# Patient Record
Sex: Female | Born: 1935 | Race: White | Hispanic: No | Smoking: Never smoker
Health system: Southern US, Community
[De-identification: ages and names within clinical notes are randomized; demographics above are authoritative.]

## PROBLEM LIST (undated history)

## (undated) DIAGNOSIS — R519 Headache, unspecified: Secondary | ICD-10-CM

## (undated) DIAGNOSIS — M542 Cervicalgia: Secondary | ICD-10-CM

## (undated) DIAGNOSIS — R51 Headache: Secondary | ICD-10-CM

## (undated) DIAGNOSIS — N390 Urinary tract infection, site not specified: Secondary | ICD-10-CM

## (undated) DIAGNOSIS — I10 Essential (primary) hypertension: Secondary | ICD-10-CM

## (undated) HISTORY — PX: APPENDECTOMY: SHX54

---

## 2005-03-29 ENCOUNTER — Ambulatory Visit: Payer: Self-pay | Admitting: Cardiology

## 2005-03-29 ENCOUNTER — Ambulatory Visit: Payer: Self-pay | Admitting: Internal Medicine

## 2005-03-29 ENCOUNTER — Inpatient Hospital Stay (HOSPITAL_COMMUNITY): Admission: EM | Admit: 2005-03-29 | Discharge: 2005-03-31 | Payer: Self-pay | Admitting: Emergency Medicine

## 2005-05-13 ENCOUNTER — Ambulatory Visit: Payer: Self-pay | Admitting: Internal Medicine

## 2006-08-09 ENCOUNTER — Encounter (HOSPITAL_COMMUNITY): Admission: RE | Admit: 2006-08-09 | Discharge: 2006-09-08 | Payer: Self-pay | Admitting: Internal Medicine

## 2007-03-01 ENCOUNTER — Ambulatory Visit (HOSPITAL_COMMUNITY): Payer: Self-pay | Admitting: Internal Medicine

## 2007-03-01 ENCOUNTER — Encounter (HOSPITAL_COMMUNITY): Admission: RE | Admit: 2007-03-01 | Discharge: 2007-03-31 | Payer: Self-pay | Admitting: Oncology

## 2008-02-20 ENCOUNTER — Encounter (HOSPITAL_COMMUNITY): Admission: RE | Admit: 2008-02-20 | Discharge: 2008-03-21 | Payer: Self-pay | Admitting: Internal Medicine

## 2008-02-20 ENCOUNTER — Ambulatory Visit (HOSPITAL_COMMUNITY): Payer: Self-pay | Admitting: Internal Medicine

## 2008-08-01 HISTORY — PX: CATARACT EXTRACTION: SUR2

## 2008-11-08 ENCOUNTER — Emergency Department (HOSPITAL_COMMUNITY): Admission: EM | Admit: 2008-11-08 | Discharge: 2008-11-08 | Payer: Self-pay | Admitting: Emergency Medicine

## 2010-05-12 ENCOUNTER — Emergency Department (HOSPITAL_COMMUNITY): Admission: EM | Admit: 2010-05-12 | Discharge: 2010-05-13 | Payer: Self-pay | Admitting: Emergency Medicine

## 2010-10-13 LAB — URINE CULTURE
Colony Count: >=100000
Culture  Setup Time: 201110140015

## 2010-10-13 LAB — DIFFERENTIAL
Basophils Absolute: 0 10*3/uL (ref 0.0–0.1)
Basophils Relative: 0 % (ref 0–1)
Eosinophils Absolute: 0.1 10*3/uL (ref 0.0–0.7)
Lymphocytes Relative: 10 % — ABNORMAL LOW (ref 12–46)
Lymphs Abs: 1.1 10*3/uL (ref 0.7–4.0)
Monocytes Absolute: 0.7 10*3/uL (ref 0.1–1.0)
Monocytes Relative: 6 % (ref 3–12)
Neutro Abs: 9.2 10*3/uL — ABNORMAL HIGH (ref 1.7–7.7)

## 2010-10-13 LAB — URINALYSIS, ROUTINE W REFLEX MICROSCOPIC
Glucose, UA: NEGATIVE mg/dL
Urobilinogen, UA: 0.2 mg/dL (ref 0.0–1.0)
pH: 5.5 (ref 5.0–8.0)

## 2010-10-13 LAB — BASIC METABOLIC PANEL
BUN: 13 mg/dL (ref 6–23)
CO2: 25 mEq/L (ref 19–32)
Calcium: 9.7 mg/dL (ref 8.4–10.5)
GFR calc Af Amer: >60 mL/min (ref >60)
Glucose, Bld: 137 mg/dL — ABNORMAL HIGH (ref 70–99)
Sodium: 135 mEq/L (ref 135–145)

## 2010-10-13 LAB — POCT CARDIAC MARKERS
Myoglobin, poc: 47 ng/mL (ref 12–200)
Troponin i, poc: <0.05 ng/mL (ref 0.00–0.09)

## 2010-10-13 LAB — CBC
HCT: 38.3 % (ref 36.0–46.0)
MCH: 26.3 pg (ref 26.0–34.0)

## 2010-10-13 LAB — LACTIC ACID, PLASMA: Lactic Acid, Venous: 2 mmol/L (ref 0.5–2.2)

## 2010-10-13 LAB — URINE MICROSCOPIC-ADD ON

## 2010-11-10 LAB — COMPREHENSIVE METABOLIC PANEL
AST: 20 U/L (ref 0–37)
Albumin: 3.9 g/dL (ref 3.5–5.2)
Calcium: 10 mg/dL (ref 8.4–10.5)
GFR calc Af Amer: >60 mL/min (ref >60)
GFR calc non Af Amer: >60 mL/min (ref >60)
Glucose, Bld: 131 mg/dL — ABNORMAL HIGH (ref 70–99)
Sodium: 135 mEq/L (ref 135–145)
Total Bilirubin: 0.8 mg/dL (ref 0.3–1.2)
Total Protein: 7.2 g/dL (ref 6.0–8.3)

## 2010-11-10 LAB — CBC
HCT: 44.3 % (ref 36.0–46.0)
MCHC: 33.9 g/dL (ref 30.0–36.0)
RDW: 14.4 % (ref 11.5–15.5)

## 2010-11-10 LAB — POCT CARDIAC MARKERS: Troponin i, poc: <0.05 ng/mL (ref 0.00–0.09)

## 2010-11-10 LAB — DIFFERENTIAL
Lymphocytes Relative: 16 % (ref 12–46)
Lymphs Abs: 1.8 10*3/uL (ref 0.7–4.0)
Monocytes Absolute: 0.9 10*3/uL (ref 0.1–1.0)
Monocytes Relative: 8 % (ref 3–12)
Neutro Abs: 8.9 10*3/uL — ABNORMAL HIGH (ref 1.7–7.7)

## 2010-11-10 LAB — URINE MICROSCOPIC-ADD ON

## 2010-11-10 LAB — URINALYSIS, ROUTINE W REFLEX MICROSCOPIC
Bilirubin Urine: NEGATIVE
Leukocytes, UA: NEGATIVE
Nitrite: NEGATIVE
Protein, ur: NEGATIVE mg/dL
Specific Gravity, Urine: 1.015 (ref 1.005–1.030)

## 2010-12-17 NOTE — Op Note (Signed)
NAME:  JHANIA, ETHERINGTON              ACCOUNT NO.:  [id number]   MEDICAL RECORD NO.:  [id number]          PATIENT TYPE:  INP   LOCATION:  A206                          FACILITY:  [facility]   PHYSICIAN:  Lionel December, M.D.    DATE OF BIRTH:  Dec 27, 1925   DATE OF PROCEDURE:  03/30/2005  DATE OF DISCHARGE:                                 OPERATIVE REPORT   PROCEDURE:  Esophagogastroduodenoscopy followed by colonoscopy.   ENDOSCOPIST:  Lionel December, M.D.   INDICATIONS:  Priscilla Wu is a 77 year old Caucasian female who is admitted  with weakness and presyncope.  She was found to have profound anemia  secondary to iron deficiency.  Her serum iron saturation and serum ferritin  were very low.  She is undergoing diagnostic evaluation.  Her stool is  guaiac negative.   Procedure risks were reviewed the patient, and informed consent was  obtained.   PREOPERATIVE MEDICATIONS:  Cetacaine spray pharyngeal topical anesthesia,  Demerol 25 mg IV, Versed 4 mg IV in divided dose.   DESCRIPTION OF PROCEDURE:  Procedure performed in endoscopy suite.  The  patient's vital signs and O2 sat were monitored during procedure and  remained stable.     1.  Esophagogastroduodenoscopy.  The patient was placed in the left lateral      position and Olympus videoscope was passed oropharynx without any      difficulty into esophagus.   Esophagus:  The mucosa of the esophagus normal.  GE junction was at 30 cm  from the incisors and was unremarkable. There was a large hernia with  dependent segment on the left side, but there was no food debris.  The  hiatus was at 38 cm.  There were three long linear ulcers involving the  herniated part of the stomach, all of which were on the lesser curvature  side.  No stigmata of active or recent bleeding noted.   Stomach:  It was empty and distended very well with insufflation.  Folds of  the proximal stomach were normal.  Examination of mucosa at body, antrum,  pyloric  channel as well as angularis, fundus, cardia was normal.  Hernia was  easily seen on this view, and no ulcers was noted at the level of hiatus or  just below that.   Duodenum:  Bulbar mucosa was normal.  The scope was passed to the second  part of duodenum.  The mucosa and folds were normal.  Endoscope was  withdrawn. The patient prepared for procedure #2.   1.  Colonoscopy:  Rectal examination performed.  No abnormality noted      external or digital exam.  Olympus videoscope was placed in the rectum      and advanced under vision ation into the sigmoid colon and beyond.      Preparation was excellent. She had some liquid stool which was easily      suctioned out.  Scope was passed into cecum.  It was identified by      appendiceal orifice and ileocecal valve.  Pictures were taken for the      record.  As the  scope was withdrawn, the colonic mucosa was carefully      examined.  Scattered diverticula noted throughout the colon and few more      in number at sigmoid colon, but there no polyps, tumor, masses or      angiodysplasia.  Rectal mucosa was normal.  The scope was retroflexed to      examine anorectal junction and small hemorrhoids were noted below the      dentate line. Endoscope was straightened and withdrawn.  The patient      tolerated the procedure well.   FINAL DIAGNOSES:  1.  Large sliding hiatal hernia with linear ulcer involving herniated part      of the stomach.  2.  Normal examination of the rest of the stomach, first and second part of      the duodenum.  3.  Pan colonic diverticulosis.  4.  Small external hemorrhoids.  5.  I feel findings on EGD would explain the patient's iron-deficiency      anemia secondary to chronic GI blood loss.   RECOMMENDATIONS:  1.  Anti-reflux measures.  I would like her to stay on PPI double dose for      now.  2.  Ferrous sulfate 325 mg p.o. bid.  3.  High-fiber diet.  4.  We would plan to see her in the office in two months.  If  response to      medical therapy is unsatisfactory would recommend surgery.      Lionel December, M.D.  Electronically Signed     NR/MEDQ  D:  03/30/2005  T:  03/31/2005  Job:  [id number]   cc:   Osvaldo Shipper, MD

## 2010-12-17 NOTE — H&P (Signed)
Priscilla Wu, Priscilla Wu              ACCOUNT NO.:  [id number]   MEDICAL RECORD NO.:  [id number]          PATIENT TYPE:  INP   LOCATION:  A206                          FACILITY:  [facility]   PHYSICIAN:  Osvaldo Shipper, MD     DATE OF BIRTH:  [birth year]   DATE OF ADMISSION:  03/29/2005  DATE OF DISCHARGE:  LH                                HISTORY & PHYSICAL   The patient does not have a medical doctor.   ADMISSION DIAGNOSES:  1.  Severe anemia.  2.  Near syncope, likely related to #1.  3.  Chronic back pain.   CHIEF COMPLAINT:  Almost passed out.   HISTORY OF PRESENT ILLNESS:  The patient is a 77 year old Caucasian female  whose medical problems just include chronic back pain which started about  three years ago secondary to vertebral fractures secondary to trauma.  The  patient just takes Tylenol for pain and denies taking any NSAIDs.  The  patient mentioned that she is an avid walker.  However, for the past six  months she has not been walking because of back problems.  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  The patient went back to walking today  with her friend.  About a half a mile into her walk, the patient felt  palpitations.  Immediately following that, she felt that she was going to  pass out, and she sat down on the floor, however, did not actually have a  syncopal episode.  The patient also gives history that for the past two  months she has been feeling more tired and weak.  She has been feeling more  short of breath with activities such as vacuuming etc.  She also gives a  history of palpitations that she has experienced in the past two months as  well.  She has never had such an episode of near syncope or episodes of  syncope in the past.   The patient denied any chest pain or shortness of breath with the current  episode.  She denies any seizure activity.  She denied any focal weakness.  The patient also does not give a history of suggestive of melena or  any  hematochezia.  She has not noticed any change in her bowel habits.  She does  give a history of occasional heartburn for which she takes Pepcid-AC  occasionally.  This is not a persistent problem.  She once again denied  excessive use of any kind of pain medication.  She only takes Tylenol very  occasionally.   MEDICATIONS:  Tylenol for pain.  Pepcid-AC, multivitamins, calcium, and  vitamin D on a p.r.n. basis.   ALLERGIES:  PENICILLIN which gives her a rash.   PAST MEDICAL HISTORY:  Very unremarkable except for lumbar vertebral  fractures resulting from a trauma about three years ago.  She does not have  any heart disease, lung disease, diabetes, strokes in the past.  She gives a  history of appendectomy and cataract surgery in the past.  She also has a  history of Bell's palsy in the  past.   SOCIAL HISTORY:  The patient  lives in [place], lives alone, denies cigarette  use, alcohol use, or any illicit drug use.  Six months prior, the patient  was an avid walker, however, has not been doing so in the past six months.  The patient has never had a colonoscopy or an EGD in the past.   FAMILY HISTORY:  Father died at the age of 33 of a stroke and pneumonia.  Mother died at the age of 16 from complications of flu.  She has one brother  and four sisters who have hypertension, cholesterol, arthritis, and her  brother has lung cancer (He is a smoker). No history of colon cancer or  other cancers in the family.   REVIEW OF SYSTEMS:  A 10-point review of systems was done which was  unremarkable.  Denied any vaginal bleeding or spotting.   PHYSICAL EXAMINATION:  Vital signs:  Temperature is 97.4, blood pressure  139/76, heart rate is 124 and regular, oxygenating 100% on 2 L, respiratory  rate is about 16.  Orthostatics were not done because the patient was  symptomatic at presentation.  General exam:  This is a moderately built elderly woman, very pleasant to  talk to, in no apparent  distress.   HEENT:  There is marked pallor.  No icterus.  Oral mucosa slightly dry.  No  oral lesions are seen.  Neck is soft and supple.   Lungs reveal a few crackles at the bases which are somewhat clear with  coughing.  No wheezing or rhonchi present.   Cardiovascular:  S1, S2 tachycardic.  Regular with no murmurs appreciated.  No bruits are heard.   Abdomen is soft, obese, nontender, and nondistended.  Bowel sounds are  present.  No organomegaly or masses are appreciated.  Rectal exam was  deferred as was done by the ER physician which was apparently heme-negative.   Extremities reveal no edema.  Peripheral pulses are palpable.   Neurologic:  The patient is alert and oriented x3.  No focal deficits are  noted.   LABORATORY DATA:  White count is 6.4, hemoglobin 7.6, hematocrit 25, MCV 61,  platelet count 309.  RDW is 18.  Neutrophil count is 80%.   PT/INR is normal.  D-dimer is normal.  No PTT is available at this time.   Sodium is 133, potassium 3.8, chloride 104, bicarbonate 22, glucose 143, BUN  11, creatinine 1.0.  LFTs within normal limits.  Albumin mildly low at 3.4.   Initial set of cardiac enzymes are negative.  BN peptide less than 30.  UA  shows mild protein, negative for infection or bilirubin.   ABG shows pH of 7.422, pCO2 33, PO2 121, bicarbonate 21.  Oxygen saturation  99%.  The patient had a chest x-ray which did not show any acute  abnormality.   EKG shows sinus at the rate of 125.  There seems to be a mild left axis  deviation.  The PR/QT/QRS are all within normal limits.  No concerning ST  changes are seen in II, III, and aVF, V1, V2, V3.  There is mild depression  of ST in V4 and V5.  No Q waves are appreciated at this time.  The ST  changes could be related to rate at this time.   IMPRESSION:  This is a 77 year old Caucasian female with no real past medical history except for chronic back pain from vertebral fractures in the  past who presents with an  episode suggestive of near syncope.  The patient  is found to have severe anemia.  She does not have a history of anemia in  the past as far as she knows.  She mentioned she had blood work done about  five years ago and everything was essentially normal at that time.  The  patient has a history of some symptoms over the past two months of feeling  more tired and weak and short of breath which could all be related to  anemia.  In an elderly woman with no obvious cause, the most concerning  reason for the anemia is malignancy which could be in the GI tract  considering microcytic nature of the anemia.  It is most likely that this  anemia is the reason for her near syncope at this time.  The patient does  not have any cardiac history.   PLAN:  1.  Near syncope, likely related to severe anemia.  We will admit the      patient, however, to telemetry and rule her out for acute coronary      syndrome.  We will check a 2D echocardiogram on this patient.   1.  Anemia.  The patient seems to have microcytic anemia.  We will check      iron profile on this patient.  I will have gastroenterology consult on      this patient to consider endoscopies.  Since the patient is mildly      symptomatic, we will transfuse her with one unit of blood at this time.      We will obtain blood work prior to the transfusion.   Further management decisions will be based on results of initial testing and  how patient responds to treatment.      Osvaldo Shipper, MD  Electronically Signed     GK/MEDQ  D:  03/29/2005  T:  03/29/2005  Job:  [id number]   cc:   R. Roetta Sessions, M.D.  [address]  [place]  [place] [address]   Dorthula Rue Early Chars, MD  Fax: [phone]

## 2010-12-17 NOTE — Procedures (Signed)
NAMESHAQUOIA, MIERS              ACCOUNT NO.:  [id number]   MEDICAL RECORD NO.:  [id number]          PATIENT TYPE:  INP   LOCATION:  A206                          FACILITY:  [facility]   PHYSICIAN:  [place] Bing, M.D. [facility] OF BIRTH:  07-Apr-1926   DATE OF PROCEDURE:  03/29/2005  DATE OF DISCHARGE:                                  ECHOCARDIOGRAM   REFERRING:  Dr. Rito Ehrlich   CLINICAL DATA:  [age]-year-old woman with syncope.   M-MODE:  Aorta 3.0, left atrium 2.9, septum 1.1, posterior wall 0.9, LV  diastole 4.2, LV systole 3.1.   1.  Technically adequate except for unacceptable quality of the apical      images.  2.  Normal left atrium, right atrium and right ventricle.  3.  Normal aortic root and aortic arch.  4.  Normal aortic, mitral, tricuspid and pulmonic valves; mild mitral      annular calcification; normal proximal pulmonary artery.  5.  Normal internal dimension, wall thickness, regional and global function      of the left ventricle.       Bing, M.D. [facility]  Electronically Signed     RR/MEDQ  D:  03/29/2005  T:  03/29/2005  Job:  [id number]

## 2010-12-17 NOTE — Consult Note (Signed)
Priscilla Wu              ACCOUNT NO.:  [id number]   MEDICAL RECORD NO.:  [id number]          PATIENT TYPE:  INP   LOCATION:  A206                          FACILITY:  [facility]   PHYSICIAN:  Lionel December, M.D.    DATE OF BIRTH:  Oct 18, 1925   DATE OF CONSULTATION:  03/29/2005  DATE OF DISCHARGE:                                   CONSULTATION   CONSULTING PHYSICIAN:  Dr. Haskell Riling.   REASON FOR CONSULTATION:  Microcytic anemia suspected to be iron-deficiency  anemia.   HISTORY OF PRESENT ILLNESS:  Priscilla Wu is a 77 year old Caucasian female who  was admitted to Dr. Chancy Milroy service earlier today following a syncopal  episode.  On initial presentation she was noted to have profound anemia.  While in the emergency room, she also had normal troponin levels.  The  patient is getting ready to receive one unit of PRBCs.  The patient states  she was find this morning.  She walked to her neighbor's so that they could  walk together.  While they were walking on a flat surface, her friend noted  her to be dyspneic.  She really did not feel anything unusual.  She stopped  walking.  She felt lightheaded and dizzy, and she thought she was going to  pass out.  She, therefore, gradually eased herself into a sitting position.  She states that she did not actually pass out.  She did not have chest pain  or diaphoresis.  Her friend called 911 and she was brought to the ER.  The  patient has a history of intermittent heartburn.  She is taking Pepcid.  She  states occasionally it is pretty bad at night, particularly if she consumes  fried food.  However, she denies dysphagia, odynophagia, abdominal pain,  melena, or rectal bleeding.  She does not take any OTC NSAIDs.  She states  her appetite is normal.  She has lost 10 pounds in the last couple of months  because she has had problems with her dentures.  She still is unable to wear  her lower dentures.   REVIEW OF SYSTEMS:  Negative for  fever, chills, or night sweats.  It is also  negative for nausea or vomiting.  She denies dysuria, hematuria, or vaginal  bleeding.  She, however, has not had any pelvic exam in at least five years.  She complains of chronic back pain.  She is generally able to control the  symptoms by not standing for extended time periods.  This started about 3-4  years ago, after she fell and broke four of her vertebrae.  She was  diagnosed with osteoporosis.  She was placed on Actonel; however, she could  not afford the medicine and presently is taking vitamin D and calcium only.  She has not undergone a colonoscopy in the past.   She is on calcium with vitamin D and multivitamin daily and Pepcid p.r.n.   PAST MEDICAL HISTORY:  1.  History of osteoporosis and multiple fractures of vertebrae, presently      on calcium with vitamin D only.  2.  She is status post appendectomy.  3.  Bilateral cataract surgery.  4.  Dr. Rito Ehrlich indicates she also had Bell palsy, although she forgot to      mention that to me.   ALLERGIES:  PENICILLIN which caused a rash.   FAMILY HISTORY:  Noncontributory.  She lost one brother of lung carcinoma at  age 73.  He was a heavy smoker and died within four months of diagnosis.  She has four sisters and a brother living.   SOCIAL HISTORY:  She is a widow.  Her husband died 12 years ago.  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  She is  now retired.  She does not smoke  cigarettes or drink alcohol.  She has five children in good health.  One of  her sons lives right behind her.  Son lives next door and checks on her  every day.   PHYSICAL EXAMINATION:  GENERAL:  A pleasant, well-developed, well-nourished,  Caucasian female who is in no acute distress.  VITAL SIGNS:  Admission weight 161.2 pounds.  She is 67 inches tall.  Pulse  129 per minute, blood pressure 139/70, respiratory rate is 18 and temp is  97.  HEENT:  Conjunctivae is pale.  Sclerae is nonicteric.   Oropharyngeal mucosa  is normal.  She has upper dentures in place.  NECK:  No neck masses or thyromegaly noted.  CARDIAC:  Regular rhythm.  Normal S1, S2.  No murmur or gallop noted.  LUNGS:  Clear to auscultation.  ABDOMEN:  Full.  Bowel sounds are normal.  On palpation it is soft and  nontender without organomegaly or masses.  RECTAL:  Reveals formed stool in the vault which is guaiac negative.  EXTREMITIES:  No peripheral edema, clubbing, or __________  noted.   LABS:  On admission, WBC 6.4, H&H is 7.6 and 25, platelet count is 309K, MCV  is 61.7.  Sodium 133, potassium 3.8, chloride 104, CO2 is 22, glucose is  143, BUN 11, creatinine 1.0.  Bilirubin is 0.3, alkaline phosphatase 58, AST  22, ALT 12, total protein 6.8 with albumin of 3.4, calcium is 9.0.  Troponin  is less than 0.05.  Her iron studies are pending.   ASSESSMENT:  [name] is a pleasant, 77 year old, Caucasian female who  presents with presyncope and found to be profoundly anemic with a hemoglobin  of 7.6 and a very low MCV.  She has intermittent heartburn but no history of  melena, rectal bleeding, hematuria, or vaginal bleed.  She also does not  have any symptoms to suggest malabsorption.  Lately she has not been eating  much in the way of meat because of problems with dentures but this would not  explain her iron deficiency.  With the presentation, we have to be concerned  about chronic blood loss from her upper gastrointestinal tract.  Given that  she has gastroesophageal reflux disease symptoms, she could be loosing blood  from her esophagus or we could be dealing with a colonic neoplasm or  angiodysplasia.  I agree with treating her with PPI and giving her one unit  of PRBCs.   RECOMMENDATIONS:  1.  Esophagogastroduodenoscopy, followed by colonoscopy to be performed in      the a.m.      1.  She will be prepped this afternoon.      2.  I have reviewed the procedures with the patient and her daughter and  she is agreeable.  2.  I also told them that if both of these studies are negative and iron      studies confirm iron-deficiency anemia, we will also need capsule study.   We would like to thank Dr. Rito Ehrlich for the opportunity to participate in  the care of this nice lady.      Lionel December, M.D.  Electronically Signed    NR/MEDQ  D:  03/29/2005  T:  03/29/2005  Job:  [id number]

## 2010-12-17 NOTE — Discharge Summary (Signed)
NAMEKEIRSTON, Priscilla Wu              ACCOUNT NO.:  [id number]   MEDICAL RECORD NO.:  [id number]          PATIENT TYPE:  INP   LOCATION:  A206                          FACILITY:  [facility]   PHYSICIAN:  Osvaldo Shipper, MD     DATE OF BIRTH:  01-27-1926   DATE OF ADMISSION:  03/29/2005  DATE OF DISCHARGE:  08/31/2006LH                                 DISCHARGE SUMMARY   DISCHARGE DIAGNOSES:  1.  Iron deficiency anemia.  2.  Large hiatal hernia with ulcerations, likely causing #1.   Please review H&P dictated at the time of admission for details regarding  the patient's presenting illness.   BRIEF HOSPITAL COURSE:  1.  Iron deficiency anemia.  Briefly, this is a 77 year old Caucasian female      with past medical history which is quite unremarkable except for a      history of osteoporosis, history of lumbar vertebral fracture secondary      to trauma, who presented to the ED after having an episode of near      syncope.  The patient did not complain of any chest pain.  Did have some      shortness of breath.  The patient was found to have severe anemia, with      a hemoglobin of 7.6.  No obvious source for blood loss was elicited      during her admission.  Since she was elderly and was complaining of some      heartburn previously, and she is profoundly anemic, the patient was      referred to gastroenterology for endoscopies.  The patient underwent EGD      and a total colonoscopy.  The EGD showed a large hiatal hernia with      three long linear ulcers involving the herniated part of the stomach.      All were found on the lesser curvature.  No active bleeding was seen      otherwise.  The rest of the stomach and the duodenum were found to be      normal.  The patient also underwent a total colonoscopy which showed      pancolonic diverticulosis which also did not suggest any active      bleeding.  The patient's iron profile studies showed iron to be 10,      ferritin was 3, saturation  percentage was 2%, TIBC was 410.  B-12 and      folate were within normal range.  The patient was severely iron      deficient.  She denied any vaginal bleeding at any time. She denied any      hematuria at any time.  The patient did undergo a UA in the hospital      which did not show any blood.  The patient was initially transfused 1      unit of blood at the time of admission.  The above blood work was      obtained prior to the transfusion.  The patient's symptoms improved.      She was able to ambulate  in the hospital within her room with no      difficulties.  She did not have any more dizziness.  She also ruled out      for acute coronary syndrome by the way of cardiac enzymes.  She also      underwent echocardiogram which essentially showed normal EF with normal      wall motion.  No other abnormalities were found.   On the day of discharge, the patient's hemoglobin is slightly low at 27.9.  Once again, the patient denies any blood loss in the form of melena,  hematochezia, hematuria, or vaginal spotting.  Since the patient is able to  ambulate with no difficulty, and she does not have any more symptoms, I will  be transfusing her 1 more unit of blood today, since the iron will take some  time to build up the stores, and I will discharge her later today.   The patient lives alone.  However, her son lives right next door to her, and  she says that she has help around the clock if need be.  Otherwise, the  patient is very active.  She goes for long walks, and she does not have any  other significant comorbidities.   DISCHARGE MEDICATIONS:  1.  Protonix 40 mg p.o. b.i.d.  2.  Ferrous sulfate 325 mg p.o. b.i.d.   FOLLOW-UP CARE:  1.  The patient is unassigned.  She would be willing to see a physician here      in [place].  Today's unassigned physician is Dr. Early Chars.  We will set      up an appointment to see him in one week.  2.  The patient will need to have a CBC checked one  day prior to her      appointment.  3.  Patient to see Dr. Karilyn Cota in his office in 8 weeks' time.   DIET:  Patient to avoid fatty food, oily food, and fried food to cause  exacerbation of her heartburn.  Otherwise, no other restrictions.   PHYSICAL ACTIVITY:  The patient may increase her activity slowly.   SPECIAL STUDIES DONE DURING THIS HOSPITAL STAY:  1.  EGD and total colonoscopy, as mentioned above.  2.  The patient underwent echocardiogram, as mentioned above.  3.  She had a portable chest x-ray which showed chronic lung changes and a      large hiatal hernia.  Otherwise, no acute process was seen.      Osvaldo Shipper, MD  Electronically Signed     GK/MEDQ  D:  03/31/2005  T:  03/31/2005  Job:  [id number]   cc:   Dorthula Rue. Early Chars, MD  Fax: [phone]   Lionel December, M.D.  [address]  [place]  [place] [address]

## 2011-08-09 ENCOUNTER — Ambulatory Visit (HOSPITAL_COMMUNITY)
Admission: RE | Admit: 2011-08-09 | Discharge: 2011-08-09 | Disposition: A | Discharge status: Home / Self Care | Payer: Medicare Other | Source: Ambulatory Visit | Attending: Family Medicine | Admitting: Family Medicine

## 2011-08-09 ENCOUNTER — Other Ambulatory Visit (HOSPITAL_COMMUNITY): Payer: Self-pay | Admitting: Family Medicine

## 2011-08-09 ENCOUNTER — Encounter (HOSPITAL_COMMUNITY): Payer: Self-pay

## 2011-08-09 DIAGNOSIS — I10 Essential (primary) hypertension: Secondary | ICD-10-CM | POA: Insufficient documentation

## 2011-08-09 DIAGNOSIS — R51 Headache: Secondary | ICD-10-CM | POA: Insufficient documentation

## 2011-08-09 HISTORY — DX: Essential (primary) hypertension: I10

## 2012-04-23 ENCOUNTER — Other Ambulatory Visit (HOSPITAL_COMMUNITY): Payer: Self-pay | Admitting: Family Medicine

## 2012-04-23 DIAGNOSIS — M810 Age-related osteoporosis without current pathological fracture: Secondary | ICD-10-CM

## 2012-04-25 ENCOUNTER — Ambulatory Visit (HOSPITAL_COMMUNITY)
Admission: RE | Admit: 2012-04-25 | Discharge: 2012-04-25 | Disposition: A | Discharge status: Home / Self Care | Payer: Medicare Other | Source: Ambulatory Visit | Attending: Family Medicine | Admitting: Family Medicine

## 2012-04-25 DIAGNOSIS — Z780 Asymptomatic menopausal state: Secondary | ICD-10-CM | POA: Insufficient documentation

## 2012-04-25 DIAGNOSIS — M810 Age-related osteoporosis without current pathological fracture: Secondary | ICD-10-CM

## 2012-04-25 DIAGNOSIS — M818 Other osteoporosis without current pathological fracture: Secondary | ICD-10-CM | POA: Insufficient documentation

## 2012-09-01 ENCOUNTER — Encounter (HOSPITAL_COMMUNITY): Payer: Self-pay | Admitting: *Deleted

## 2012-09-01 ENCOUNTER — Emergency Department (HOSPITAL_COMMUNITY)
Admission: EM | Admit: 2012-09-01 | Discharge: 2012-09-01 | Disposition: A | Discharge status: Home / Self Care | Payer: Medicare Other | Attending: Emergency Medicine | Admitting: Emergency Medicine

## 2012-09-01 ENCOUNTER — Emergency Department (HOSPITAL_COMMUNITY): Payer: Medicare Other

## 2012-09-01 DIAGNOSIS — I10 Essential (primary) hypertension: Secondary | ICD-10-CM | POA: Insufficient documentation

## 2012-09-01 DIAGNOSIS — Z79899 Other long term (current) drug therapy: Secondary | ICD-10-CM | POA: Insufficient documentation

## 2012-09-01 DIAGNOSIS — H7290 Unspecified perforation of tympanic membrane, unspecified ear: Secondary | ICD-10-CM

## 2012-09-01 DIAGNOSIS — H6500 Acute serous otitis media, unspecified ear: Secondary | ICD-10-CM | POA: Insufficient documentation

## 2012-09-01 DIAGNOSIS — Z87440 Personal history of urinary (tract) infections: Secondary | ICD-10-CM | POA: Insufficient documentation

## 2012-09-01 DIAGNOSIS — R42 Dizziness and giddiness: Secondary | ICD-10-CM | POA: Insufficient documentation

## 2012-09-01 DIAGNOSIS — H6590 Unspecified nonsuppurative otitis media, unspecified ear: Secondary | ICD-10-CM

## 2012-09-01 HISTORY — DX: Urinary tract infection, site not specified: N39.0

## 2012-09-01 LAB — COMPREHENSIVE METABOLIC PANEL
ALT: 12 U/L (ref 0–35)
AST: 15 U/L (ref 0–37)
Alkaline Phosphatase: 55 U/L (ref 39–117)
BUN: 16 mg/dL (ref 6–23)
Chloride: 95 mEq/L — ABNORMAL LOW (ref 96–112)
Creatinine, Ser: 0.91 mg/dL (ref 0.50–1.10)
GFR calc Af Amer: 65 mL/min — ABNORMAL LOW (ref >90)
Glucose, Bld: 103 mg/dL — ABNORMAL HIGH (ref 70–99)

## 2012-09-01 LAB — URINALYSIS, ROUTINE W REFLEX MICROSCOPIC
Bilirubin Urine: NEGATIVE
Glucose, UA: NEGATIVE mg/dL
Nitrite: NEGATIVE
Protein, ur: NEGATIVE mg/dL
Urobilinogen, UA: 0.2 mg/dL (ref 0.0–1.0)
pH: 7 (ref 5.0–8.0)

## 2012-09-01 LAB — CBC WITH DIFFERENTIAL/PLATELET
Basophils Absolute: 0.1 10*3/uL (ref 0.0–0.1)
Basophils Relative: 1 % (ref 0–1)
Lymphs Abs: 2 10*3/uL (ref 0.7–4.0)
MCHC: 32.3 g/dL (ref 30.0–36.0)
MCV: 84.3 fL (ref 78.0–100.0)
Monocytes Absolute: 0.6 10*3/uL (ref 0.1–1.0)
Neutro Abs: 4.7 10*3/uL (ref 1.7–7.7)
Neutrophils Relative %: 63 % (ref 43–77)
RBC: 5.03 MIL/uL (ref 3.87–5.11)
RDW: 14 % (ref 11.5–15.5)

## 2012-09-01 MED ORDER — SODIUM CHLORIDE 0.9 % IV SOLN
INTRAVENOUS | Status: DC
  Administered 2012-09-01: 16:00:00 via INTRAVENOUS

## 2012-09-01 MED ORDER — SODIUM CHLORIDE 0.9 % IV BOLUS (SEPSIS)
250.0000 mL | Freq: Once | INTRAVENOUS | Status: AC
  Administered 2012-09-01: 250 mL via INTRAVENOUS

## 2012-09-01 NOTE — ED Notes (Signed)
Pt states pain to left ear x 1 week. Dizziness when she woke up and fell this morning. Family states she hasn't felt well all week.

## 2012-09-01 NOTE — ED Provider Notes (Signed)
History  Scribed for Priscilla Jakes, MD, the patient was seen in room APA04/APA04. This chart was scribed by Candelaria Stagers. The patient's care started at 4:25 PM    CSN: [id number]  Arrival date & time 09/01/12  1600   First MD Initiated Contact with Patient 09/01/12 1613      Chief Complaint  Patient presents with  . Otalgia  . Fall     The history is provided by the patient and a relative. No language interpreter was used.   Priscilla Wu is a 79 y.o. female who presents to the Emergency Department complaining of left ear pain and dizziness that started about one week ago and became worse today.  She reports she has had one episode of dizziness this week and an episode today followed by losing her balance and falling.  She has no injuries from the fall.  She denies vertigo and states the dizziness feels like her balance is off.  Her family expresses concern over weakness that started over this last week.  She denies visual changes, SOB, abdominal pain, nausea, vomiting, or diarrhea.  She has been experiencing cold sx over the last few weeks.  Pt also reports drainage from the left ear.        Past Medical History  Diagnosis Date  . Hypertension   . UTI (lower urinary tract infection)     Past Surgical History  Procedure Date  . Appendectomy     No family history on file.  History  Substance Use Topics  . Smoking status: Never Smoker   . Smokeless tobacco: Not on file  . Alcohol Use: No    OB History    Grav Para Term Preterm Abortions TAB SAB Ect Mult Living                  Review of Systems  Constitutional: Negative for fever and chills.  HENT: Positive for ear pain (left ear pain), congestion and ear discharge (left ear discharge).   Eyes: Negative for visual disturbance.  Respiratory: Negative for shortness of breath.   Cardiovascular: Negative for chest pain.  Gastrointestinal: Negative for nausea, vomiting, abdominal pain and diarrhea.   Genitourinary: Negative for dysuria.  Musculoskeletal: Negative for back pain and arthralgias.  Skin: Negative for rash.  Neurological: Positive for dizziness (no vertigo) and weakness.  All other systems reviewed and are negative.    Allergies  Penicillins  Home Medications   Current Outpatient Rx  Name  Route  Sig  Dispense  Refill  . LISINOPRIL-HYDROCHLOROTHIAZIDE 20-12.5 MG PO TABS   Oral   Take 1 tablet by mouth daily.         Marland Kitchen ONE-DAILY MULTI VITAMINS PO TABS   Oral   Take 1 tablet by mouth daily.         Marland Kitchen OMEPRAZOLE MAGNESIUM 20 MG PO TBEC   Oral   Take 20 mg by mouth daily.           BP 134/78  Pulse 116  Temp 97.6 F (36.4 C) (Oral)  Resp 18  Ht 5\' 5"  (1.651 m)  Wt 145 lb (65.772 kg)  BMI 24.13 kg/m2  SpO2 97%  Physical Exam  Nursing note and vitals reviewed. Constitutional: She is oriented to person, place, and time. She appears well-developed and well-nourished. No distress.  HENT:  Head: Normocephalic and atraumatic.  Right Ear: Tympanic membrane normal.  Mouth/Throat: Oropharynx is clear and moist.       Possible  hole in left eardrum  Eyes: EOM are normal.  Neck: Neck supple. No tracheal deviation present.  Cardiovascular: Regular rhythm.   No murmur heard.      Tachycardic   Pulmonary/Chest: Effort normal and breath sounds normal. No respiratory distress. She has no wheezes. She has no rales.  Abdominal: Bowel sounds are normal. There is no tenderness.  Musculoskeletal: Normal range of motion. She exhibits no edema and no tenderness.  Neurological: She is alert and oriented to person, place, and time. No cranial nerve deficit.       Normal strength, equal and bilateral.  No pronator drift.  Skin: Skin is warm and dry.  Psychiatric: She has a normal mood and affect. Her behavior is normal.    ED Course  Procedures  DIAGNOSTIC STUDIES: Oxygen Saturation is 97% on room air, normal by my interpretation.    COORDINATION OF  CARE: 4:15 PM Ordered: CBC with Differential; Comprehensive metabolic panel; Urinalysis, Routine w reflex microscopic; ED EKG 4:32 PM Will order screenings and CT Head.  Advised pt to follow up with HENT for left ear pain.  Pt and family understand and agree.  4:37PM Ordered: CT Head Wo Contrast   Labs Reviewed  COMPREHENSIVE METABOLIC PANEL - Abnormal; Notable for the following:    Sodium 133 (*)     Chloride 95 (*)     Glucose, Bld 103 (*)     Calcium 10.6 (*)     GFR calc non Af Amer 56 (*)     GFR calc Af Amer 65 (*)     All other components within normal limits  CBC WITH DIFFERENTIAL  URINALYSIS, ROUTINE W REFLEX MICROSCOPIC   Ct Head Wo Contrast  09/01/2012  *RADIOLOGY REPORT*  Clinical Data: Dizzy and weak.  Otalgia.  CT HEAD WITHOUT CONTRAST  Technique:  Contiguous axial images were obtained from the base of the skull through the vertex without contrast.  Comparison: None.  Findings: Mild generalized atrophy white matter disease is similar to the prior study.  No acute cortical infarct, hemorrhage, or mass lesion is present.  The ventricles are proportionate to the degree of atrophy.  No significant extra-axial fluid collection is present.  Wall thickening is present in the maxillary sinuses bilaterally. The paranasal sinuses and mastoid air cells are clear.  IMPRESSION:  1.  No acute intracranial abnormality or significant interval change. 2.  Stable atrophy white matter disease. 3.  Thickening of the walls of the maxillary sinus suggests a history chronic or recurrent sinusitis.  There is no active disease.   Original Report Authenticated By: Marin Roberts, M.D.     Date: 09/01/2012  Rate: 102  Rhythm: sinus tachycardia  QRS Axis: normal  Intervals: normal  ST/T Wave abnormalities: normal  Conduction Disutrbances:first-degree A-V block   Narrative Interpretation:   Old EKG Reviewed: unchanged From 05/12/10   Results for orders placed during the hospital encounter of  09/01/12  CBC WITH DIFFERENTIAL      Component Value Range   WBC 7.5  4.0 - 10.5 K/uL   RBC 5.03  3.87 - 5.11 MIL/uL   Hemoglobin 13.7  12.0 - 15.0 g/dL   HCT 47.8  29.5 - 62.1 %   MCV 84.3  78.0 - 100.0 fL   MCH 27.2  26.0 - 34.0 pg   MCHC 32.3  30.0 - 36.0 g/dL   RDW 30.8  65.7 - 84.6 %   Platelets 173  150 - 400 K/uL   Neutrophils Relative 63  43 - 77 %   Neutro Abs 4.7  1.7 - 7.7 K/uL   Lymphocytes Relative 27  12 - 46 %   Lymphs Abs 2.0  0.7 - 4.0 K/uL   Monocytes Relative 8  3 - 12 %   Monocytes Absolute 0.6  0.1 - 1.0 K/uL   Eosinophils Relative 2  0 - 5 %   Eosinophils Absolute 0.2  0.0 - 0.7 K/uL   Basophils Relative 1  0 - 1 %   Basophils Absolute 0.1  0.0 - 0.1 K/uL  COMPREHENSIVE METABOLIC PANEL      Component Value Range   Sodium 133 (*) 135 - 145 mEq/L   Potassium 3.9  3.5 - 5.1 mEq/L   Chloride 95 (*) 96 - 112 mEq/L   CO2 28  19 - 32 mEq/L   Glucose, Bld 103 (*) 70 - 99 mg/dL   BUN 16  6 - 23 mg/dL   Creatinine, Ser 1.19  0.50 - 1.10 mg/dL   Calcium 14.7 (*) 8.4 - 10.5 mg/dL   Total Protein 7.7  6.0 - 8.3 g/dL   Albumin 3.9  3.5 - 5.2 g/dL   AST 15  0 - 37 U/L   ALT 12  0 - 35 U/L   Alkaline Phosphatase 55  39 - 117 U/L   Total Bilirubin 0.3  0.3 - 1.2 mg/dL   GFR calc non Af Amer 56 (*) >90 mL/min   GFR calc Af Amer 65 (*) >90 mL/min  URINALYSIS, ROUTINE W REFLEX MICROSCOPIC      Component Value Range   Color, Urine YELLOW  YELLOW   APPearance CLEAR  CLEAR   Specific Gravity, Urine 1.010  1.005 - 1.030   pH 7.0  5.0 - 8.0   Glucose, UA NEGATIVE  NEGATIVE mg/dL   Hgb urine dipstick NEGATIVE  NEGATIVE   Bilirubin Urine NEGATIVE  NEGATIVE   Ketones, ur NEGATIVE  NEGATIVE mg/dL   Protein, ur NEGATIVE  NEGATIVE mg/dL   Urobilinogen, UA 0.2  0.0 - 1.0 mg/dL   Nitrite NEGATIVE  NEGATIVE   Leukocytes, UA NEGATIVE  NEGATIVE     1. Dizziness   2. Tympanic membrane perforation   3. Serous otitis media       MDM  Patient with balance disturbance  not true vertigo not true dizziness. Workup for this was negative except for the finding of a left TM perforation with serous discharge no purulent discharge. This may be responsible for her balance being off. The rest of her neuro exam was normal head CT did not show any evidence of a stroke. However MRI would be more specific. Recommend followup with her primary care doctor on Monday for recheck of the left ear and also for consideration for ordering an MRI. MRI not available at this time in the ED. Patient also has an ear nose and throat Dr. Nicholes Rough recommended to make an appointment for followup with them as well.  Patient has a walker that can be used at home she knows to be very careful so that she doesn't fall again.     I personally performed the services described in this documentation, which was scribed in my presence. The recorded information has been reviewed and is accurate.          Priscilla Jakes, MD 09/01/12 [phone]

## 2012-09-10 ENCOUNTER — Ambulatory Visit (HOSPITAL_COMMUNITY)
Admission: RE | Admit: 2012-09-10 | Discharge: 2012-09-10 | Disposition: A | Discharge status: Home / Self Care | Payer: Medicare Other | Source: Ambulatory Visit | Attending: Family Medicine | Admitting: Family Medicine

## 2012-09-10 DIAGNOSIS — IMO0001 Reserved for inherently not codable concepts without codable children: Secondary | ICD-10-CM | POA: Insufficient documentation

## 2012-09-10 DIAGNOSIS — Z9181 History of falling: Secondary | ICD-10-CM | POA: Insufficient documentation

## 2012-09-10 DIAGNOSIS — R29898 Other symptoms and signs involving the musculoskeletal system: Secondary | ICD-10-CM | POA: Insufficient documentation

## 2012-09-10 DIAGNOSIS — M6281 Muscle weakness (generalized): Secondary | ICD-10-CM | POA: Insufficient documentation

## 2012-09-10 NOTE — Evaluation (Signed)
Physical Therapy Evaluation  Patient Details  Name: Priscilla Wu MRN: [id number] Date of Birth: Aug 12, 1925  Today's Date: 09/10/2012 Time: 1113-1205 PT Time Calculation (min): 52 min Charge:  Eval, there ex Visit#: 1 of 8  Re-eval: 10/10/12 Assessment Diagnosis: dizziness  Authorization: medicare    Authorization Visit#: 1 of 8   Past Medical History:  Past Medical History  Diagnosis Date  . Hypertension   . UTI (lower urinary tract infection)    Past Surgical History:  Past Surgical History  Procedure Laterality Date  . Appendectomy      Subjective Symptoms/Limitations Symptoms: Ms. Rallo states that she has noticed that when she gets up she wants fo fall backwards.  She does not feel as if the room is spinning and has no nausea.  This happened a second time and she fell in the bathroom.  She has not falllen since. How long can you walk comfortably?: walking with cane with increased Kyphosis and decreased step length  Precautions/Restrictions  Precautions Precautions: Fall  Prior Functioning  Home Living Lives With: Alone   Sensation/Coordination/Flexibility/Functional Tests Functional Tests Functional Tests: - Hallpike-Dix   Assessment RLE Strength Right Hip Flexion: 4/5 Right Hip Extension: 3+/5 Right Hip ABduction: 3/5 Right Knee Flexion: 4/5 Right Knee Extension: 3+/5 Right Ankle Dorsiflexion:  (4-/5) LLE Strength Left Hip Flexion: 3/5 Left Hip Extension: 2+/5 Left Hip ABduction: 4/5 Left Knee Flexion: 5/5 Left Knee Extension: 4/5 Left Ankle Dorsiflexion:  (4-/5)  Exercise/Treatments Mobility/Balance  Berg Balance Test Sit to Stand: Able to stand  independently using hands Standing Unsupported: Able to stand 2 minutes with supervision Sitting with Back Unsupported but Feet Supported on Floor or Stool: Able to sit safely and securely 2 minutes Stand to Sit: Controls descent by using hands Transfers: Able to transfer safely, definite need of  hands Standing Unsupported with Eyes Closed: Able to stand 10 seconds with supervision Standing Ubsupported with Feet Together: Able to place feet together independently and stand for 1 minute with supervision From Standing, Reach Forward with Outstretched Arm: Can reach forward >12 cm safely (5") From Standing Position, Pick up Object from Floor: Able to pick up shoe, needs supervision From Standing Position, Turn to Look Behind Over each Shoulder: Turn sideways only but maintains balance Turn 360 Degrees: Able to turn 360 degrees safely in 4 seconds or less Standing Unsupported, Alternately Place Feet on Step/Stool: Able to stand independently and safely and complete 8 steps in 20 seconds Standing Unsupported, One Foot in Front: Able to take small step independently and hold 30 seconds Standing on One Leg: Tries to lift leg/unable to hold 3 seconds but remains standing independently Total Score: 41 Timed Up and Go Test TUG: Normal TUG Normal TUG (seconds): 18   Balance Exercises     Seated Other Seated Exercises: cervical ROM x 5  Supine Other Supine Exercises: Trunk Rot, bridge, SLR x 10; SL ab x 10     Physical Therapy Assessment and Plan PT Assessment and Plan Clinical Impression Statement: Pt with decreased balance and decreased LE strength who has fallen twice in the past few weeks who will benefit from skilled PT to decrease risk of falling. Pt will benefit from skilled therapeutic intervention in order to improve on the following deficits: Difficulty walking;Decreased balance;Decreased activity tolerance;Decreased strength Rehab Potential: Good PT Frequency: Min 2X/week PT Duration: 4 weeks PT Treatment/Interventions: Functional mobility training;Therapeutic activities;Therapeutic exercise;Patient/family education;Balance training PT Plan: begin bike, rockerboard, tandem gt, retro gt, heel raise, minisquats, SLS, sit  to stand next session; third session add foam rotation and  1-15; progress     Goals Home Exercise Program Pt will Perform Home Exercise Program: Independently PT Short Term Goals Time to Complete Short Term Goals: 2 weeks PT Short Term Goal 1: no falls PT Long Term Goals Time to Complete Long Term Goals: 4 weeks PT Long Term Goal 1: Berg test improved 10 pts. PT Long Term Goal 2: no falls Long Term Goal 3: I in advance HEP Long Term Goal 4: TUG to be 14 sec or less to decrease risk of falling PT Long Term Goal 5: strength iimproved one grade to reduce risk of falling.  Problem List Patient Active Problem List  Diagnosis  . Hx of falling, presenting hazards to health  . Bilateral leg weakness    General Behavior During Session: [facility] for tasks performed PT Plan of Care PT Home Exercise Plan: given for strengthening Consulted and Agree with Plan of Care: Patient  GP Functional Assessment Tool Used: clinical judgement Functional Limitation: Self care Self Care Current Status (Z6109): At least 20 percent but less than 40 percent impaired, limited or restricted Self Care Goal Status (U0454): At least 1 percent but less than 20 percent impaired, limited or restricted  Priscilla Wu 09/10/2012, 12:28 PM  Physician Documentation Your signature is required to indicate approval of the treatment plan as stated above.  Please sign and either send electronically or make a copy of this report for your files and return this physician signed original.   Please mark one 1.__approve of plan  2. ___approve of plan with the following conditions.   ______________________________                                                          _____________________ Physician Signature                                                                                                             Date

## 2012-09-13 ENCOUNTER — Ambulatory Visit (HOSPITAL_COMMUNITY): Payer: Medicare Other | Admitting: *Deleted

## 2012-09-19 ENCOUNTER — Ambulatory Visit (HOSPITAL_COMMUNITY)
Admission: RE | Admit: 2012-09-19 | Discharge: 2012-09-19 | Disposition: A | Discharge status: Home / Self Care | Payer: Medicare Other | Source: Ambulatory Visit | Attending: *Deleted | Admitting: *Deleted

## 2012-09-19 NOTE — Progress Notes (Signed)
Physical Therapy Treatment Patient Details  Name: Priscilla Wu MRN: [id number] Date of Birth: Jun 04, 1926  Today's Date: 09/19/2012 Time: 4098-1191 PT Time Calculation (min): 42 min  Visit#: 2 of 8  Re-eval: 10/10/12 Charges: Therex x 15' NMR x 25'  Authorization: medicare  Authorization Visit#: 2 of 8   Subjective: Symptoms/Limitations Symptoms: Pt states that her balance appears to be improving.    Exercise/Treatments Standing SLS: Limitations SLS Limitations: R: 5" L: 3"  max of 5 Wall Bumps: 10 reps;Hips Tandem Gait: 1 rep Retro Gait: 1 rep Heel Raises: 10 reps Toe Raise: 15 reps Sit to Stand: Standard surface;Limitations Sit to Stand Limitations: x10 w/o UE assist Other Standing Exercises: Rocker board R/L x 2' Other Standing Exercises: Fucntional squats x 10   Physical Therapy Assessment and Plan PT Assessment and Plan Clinical Impression Statement: Began balance and strengthening activities this session. Pt requires two seated rest breaks throughout tx but otherwise tolerates tx well. Pt is eager to complete exercises and become stronger. PT requires CGA/min assist to maintain balance with NRM activities. PT Plan: Continue to progress strength, activity tolerance and balance. Next session add foam rotation and 1-15.     Goals    Problem List Patient Active Problem List  Diagnosis  . Hx of falling, presenting hazards to health  . Bilateral leg weakness    PT - End of Session Equipment Utilized During Treatment: Gait belt General Behavior During Session: [facility] for tasks performed Cognition: [facility] for tasks performed  GP Functional Assessment Tool Used: clinical judgement  Seth Bake, PTA  09/19/2012, 12:05 PM

## 2012-09-21 ENCOUNTER — Ambulatory Visit (HOSPITAL_COMMUNITY)
Admission: RE | Admit: 2012-09-21 | Discharge: 2012-09-21 | Disposition: A | Discharge status: Home / Self Care | Payer: Medicare Other | Source: Ambulatory Visit | Attending: Family Medicine | Admitting: Family Medicine

## 2012-09-21 DIAGNOSIS — Z9181 History of falling: Secondary | ICD-10-CM

## 2012-09-21 NOTE — Progress Notes (Signed)
Physical Therapy Treatment Patient Details  Name: KRISTIN BARCUS MRN: [id number] Date of Birth: 1926/07/28 Charge:  There activity x 40 Today's Date: 09/21/2012 Time: 4098-1191 PT Time Calculation (min): 41 min  Visit#: 3 of 8  Re-eval: 10/10/12    Authorization: medicare  Authorization Visit#: 3 of 8   Subjective: Symptoms/Limitations Symptoms: Pt is doing her exercises at home and feels they are helping .   Exercise/Treatments  Balance Exercises Standing Standing Eyes Closed: Foam;2 reps;30 secs SLS: Limitations SLS Limitations: R: 5" L: 3"  max of 5 Tandem Gait: 2 reps Retro Gait: 2 reps Sidestepping: 2 reps Numbers 1-15: 1 rep Cone Rotation: Other (comment) (1 rep) Heel Raises: 10 reps Sit to Stand: Standard surface;Limitations Sit to Stand Limitations: x5 w/o UE assist Other Standing Exercises: Rocker board R/L x 2'A/P x 2' Other Standing Exercises: Fucntional squats x 10     Physical Therapy Assessment and Plan PT Assessment and Plan Clinical Impression Statement: Pt instructed in improving gt to decrease risk of fall.  Pt walks forward bent with quick short strides.  Worked with pt to be upright elongate stride and decrease speed.  Added foam activities to program.   PT Plan: Continue to progress strength, activity tolerance and balance. Add standing with B UE flex B     Goals Home Exercise Program PT Goal: Perform Home Exercise Program - Progress: Met PT Short Term Goals PT Short Term Goal 1: no falls PT Short Term Goal 1 - Progress: Progressing toward goal PT Long Term Goals Time to Complete Long Term Goals: 4 weeks PT Long Term Goal 1: Berg test improved 10 pts. PT Long Term Goal 1 - Progress: Progressing toward goal PT Long Term Goal 2: no falls PT Long Term Goal 2 - Progress: Progressing toward goal Long Term Goal 3: I in advance HEP Long Term Goal 3 Progress: Progressing toward goal Long Term Goal 4: TUG to be 14 sec or less to decrease risk of  falling Long Term Goal 4 Progress: Progressing toward goal PT Long Term Goal 5: strength iimproved one grade to reduce risk of falling. Long Term Goal 5 Progress: Progressing toward goal  Problem List Patient Active Problem List  Diagnosis  . Hx of falling, presenting hazards to health  . Bilateral leg weakness    PT - End of Session Equipment Utilized During Treatment: Gait belt General Behavior During Session: [facility] for tasks performed Cognition: [facility] for tasks performed  GP Functional Assessment Tool Used: clinical judgement  [name],[name] 09/21/2012, 11:58 AM

## 2012-09-24 ENCOUNTER — Ambulatory Visit (HOSPITAL_COMMUNITY)
Admission: RE | Admit: 2012-09-24 | Discharge: 2012-09-24 | Disposition: A | Discharge status: Home / Self Care | Payer: Medicare Other | Source: Ambulatory Visit | Attending: Family Medicine | Admitting: Family Medicine

## 2012-09-24 DIAGNOSIS — Z9181 History of falling: Secondary | ICD-10-CM

## 2012-09-24 NOTE — Progress Notes (Signed)
Physical Therapy Treatment Patient Details  Name: Priscilla Wu MRN: [id number] Date of Birth: July 12, 1926  Today's Date: 09/24/2012 Time: 1112-1204 PT Time Calculation (min): 52 min  Visit#: 4 of 8  Re-eval: 10/10/12    Authorization: medicare  Authorization Visit#: 4 of 8   Subjective: Symptoms/Limitations Symptoms: Pt states she was sore after last treatment but she is feeling better now    Exercise/Treatments   Balance Exercises Standing Standing Eyes Closed: Foam;2 reps;30 secs SLS: Limitations SLS Limitations: R: 5" L: 3"  max of 5 Tandem Gait: 2 reps (on foam) Retro Gait: 2 reps (on foam) Sidestepping: 2 reps (on foam) Numbers 1-15: 1 rep Cone Rotation: Other (comment) (1 rep) Heel Raises: 10 reps Other Standing Exercises: Rocker board R/L x 2'A/P x 2' Other Standing Exercises: Fucntional squats x 10; B UE flex x 10; B ab/ext x 10   Seated Other Seated Exercises: bike x 6'  Supine       Physical Therapy Assessment and Plan PT Assessment and Plan Clinical Impression Statement: Pt needed multiple rest breaks throughout treatment.  Pt began standing UE flex with verbal cuing to keep core tight.  Pt improving in ambulation,(keeping shoulders back and slowing stride to be safer.) Pt will benefit from skilled therapeutic intervention in order to improve on the following deficits: Difficulty walking;Decreased balance;Decreased activity tolerance;Decreased strength Rehab Potential: Good PT Plan: Continue to progress strength, activity tolerance and balance.    Goals Home Exercise Program PT Goal: Perform Home Exercise Program - Progress: Met PT Short Term Goals PT Short Term Goal 1: no falls PT Short Term Goal 1 - Progress: Progressing toward goal PT Long Term Goals PT Long Term Goal 1: Berg test improved 10 pts. PT Long Term Goal 1 - Progress: Progressing toward goal PT Long Term Goal 2: no falls PT Long Term Goal 2 - Progress: Progressing toward goal Long  Term Goal 3 Progress: Progressing toward goal PT Long Term Goal 5: strength iimproved one grade to reduce risk of falling. Long Term Goal 5 Progress: Progressing toward goal  Problem List Patient Active Problem List  Diagnosis  . Hx of falling, presenting hazards to health  . Bilateral leg weakness    PT - End of Session Equipment Utilized During Treatment: Gait belt Activity Tolerance: Patient tolerated treatment well General Behavior During Session: [facility] for tasks performed Cognition: [facility] for tasks performed  GP Functional Assessment Tool Used: clinical judgement  Priscilla Wu,[name] 09/24/2012, 12:03 PM

## 2012-09-27 ENCOUNTER — Ambulatory Visit (HOSPITAL_COMMUNITY)
Admission: RE | Admit: 2012-09-27 | Discharge: 2012-09-27 | Disposition: A | Discharge status: Home / Self Care | Payer: Medicare Other | Source: Ambulatory Visit | Attending: *Deleted | Admitting: *Deleted

## 2012-09-27 NOTE — Progress Notes (Signed)
Physical Therapy Treatment Patient Details  Name: Priscilla Wu MRN: [id number] Date of Birth: September 05, 1925  Today's Date: 09/27/2012 Time: 1100-1145 PT Time Calculation (min): 45 min  Visit#: 5 of 8  Re-eval: 10/10/12 Charges: NMR x 30' Therex x 10'   Authorization: medicare  Authorization Visit#: 5 of 8   Subjective: Symptoms/Limitations Symptoms: Pt states that her diziness is much better. Pain Assessment Currently in Pain?: No/denies   Exercise/Treatments Balance Exercises Standing SLS Limitations: R: 12" L: 4"  max of 5 Tandem Gait: 2 reps;Limitations Tandem Gait Limitations: on foam Retro Gait: 2 reps;Limitations Retro Gait Limitations: on foam Sidestepping: 2 reps;Limitations Sidestepping Limitations: on foam Numbers 1-15: 1 rep Cone Rotation: Other (comment) Heel Raises: 10 reps Toe Raise: 10 reps Other Standing Exercises: Rocker board R/L x 2'A/P x 2' Other Standing Exercises: Fucntional squats x 10; B UE flex x 10; B ab/ext x 10  Seated Other Seated Exercises: Bike 8'@2 .0   Physical Therapy Assessment and Plan PT Assessment and Plan Clinical Impression Statement: Pt only required one rest break this session. Pt had multiple LOB with NMR activities on foam requiring min assist to recover. Pt's balance and activity tolerance appear to be progressing well. PT Plan: Continue to progress strength, activity tolerance and balance.     Problem List Patient Active Problem List  Diagnosis  . Hx of falling, presenting hazards to health  . Bilateral leg weakness    PT - End of Session Equipment Utilized During Treatment: Gait belt Activity Tolerance: Patient tolerated treatment well General Behavior During Session: [facility] for tasks performed Cognition: [facility] for tasks performed  GP Functional Assessment Tool Used: clinical judgement  Seth Bake, PTA  09/27/2012, 12:15 PM

## 2012-10-01 ENCOUNTER — Ambulatory Visit (HOSPITAL_COMMUNITY)
Admission: RE | Admit: 2012-10-01 | Discharge: 2012-10-01 | Disposition: A | Discharge status: Home / Self Care | Payer: Medicare Other | Source: Ambulatory Visit | Attending: Family Medicine | Admitting: Family Medicine

## 2012-10-01 DIAGNOSIS — IMO0001 Reserved for inherently not codable concepts without codable children: Secondary | ICD-10-CM | POA: Insufficient documentation

## 2012-10-01 DIAGNOSIS — M6281 Muscle weakness (generalized): Secondary | ICD-10-CM | POA: Insufficient documentation

## 2012-10-01 DIAGNOSIS — Z9181 History of falling: Secondary | ICD-10-CM | POA: Insufficient documentation

## 2012-10-01 NOTE — Progress Notes (Signed)
Physical Therapy Treatment Patient Details  Name: Priscilla Wu MRN: [id number] Date of Birth: Jan 07, 1926  Today's Date: 10/01/2012 Time: 4098-1191 PT Time Calculation (min): 43 min  Visit#: 6 of 8  Re-eval: 10/10/12 Charges: Therex x 12' NMR x 28'   Authorization: medicare  Authorization Visit#: 6 of 8   Subjective: Symptoms/Limitations Symptoms: Pt states that she has not had any dizziness in the past week. Pain Assessment Currently in Pain?: No/denies   Exercise/Treatments Balance Exercises Standing Wall Bumps: 10 reps;Hips;Limitations Wall Bumps Limitations: Toes 2' from wall Tandem Gait: 2 reps;Limitations Tandem Gait Limitations: on foam Retro Gait: 2 reps;Limitations Retro Gait Limitations: on foam Sidestepping: 2 reps;Limitations Sidestepping Limitations: on foam Heel Raises: 15 reps Toe Raise: 15 reps Other Standing Exercises: Rocker board R/L x 2'A/P x 2' Other Standing Exercises: Fucntional squats x 10; B UE flex x 10; B ab/ext x 10  Seated Other Seated Exercises: Bike 8'@2 .0   Physical Therapy Assessment and Plan PT Assessment and Plan Clinical Impression Statement: Pt's confidence, activity tolerance and stability all appear to be progressing well. Pt was able to complete entire session without a rest break. Pt requires vc's to encourage larger steps with activities on foam balance beam. Pt is without complaint throughout session. PT Plan: Continue to progress strength, activity tolerance and balance.    Problem List Patient Active Problem List  Diagnosis  . Hx of falling, presenting hazards to health  . Bilateral leg weakness    PT - End of Session Equipment Utilized During Treatment: Gait belt Activity Tolerance: Patient tolerated treatment well General Behavior During Session: [facility] for tasks performed Cognition: [facility] for tasks performed   Seth Bake, PTA  10/01/2012, 11:32 AM

## 2012-10-04 ENCOUNTER — Ambulatory Visit (HOSPITAL_COMMUNITY): Payer: Medicare Other | Admitting: *Deleted

## 2012-10-08 ENCOUNTER — Telehealth (HOSPITAL_COMMUNITY): Payer: Self-pay | Admitting: Physical Therapy

## 2012-10-08 ENCOUNTER — Inpatient Hospital Stay (HOSPITAL_COMMUNITY): Admission: RE | Admit: 2012-10-08 | Payer: Medicare Other | Source: Ambulatory Visit | Admitting: Physical Therapy

## 2012-10-18 ENCOUNTER — Ambulatory Visit (HOSPITAL_COMMUNITY)
Admission: RE | Admit: 2012-10-18 | Discharge: 2012-10-18 | Disposition: A | Discharge status: Home / Self Care | Payer: Medicare Other | Source: Ambulatory Visit | Attending: Family Medicine | Admitting: Family Medicine

## 2012-10-18 DIAGNOSIS — Z9181 History of falling: Secondary | ICD-10-CM

## 2012-10-18 NOTE — Evaluation (Signed)
Physical Therapy Evaluation  Patient Details  Name: Priscilla Wu MRN: [id number] Date of Birth: 1926-03-05 Charge:  Mm test; physical performance test x 16; there ex x 10 Today's Date: 10/18/2012 Time: 4098-1191 PT Time Calculation (min): 44 min              Visit#: 7 of 8  Re-eval:   Assessment Diagnosis: dizziness  Authorization: medicare      Authorization Visit#: 7 of 8   Past Medical History:  Past Medical History  Diagnosis Date  . Hypertension   . UTI (lower urinary tract infection)    Past Surgical History:  Past Surgical History  Procedure Laterality Date  . Appendectomy      Subjective Symptoms/Limitations Symptoms: Pt states she has not been dizzy for a long time she has been on the go for a couple of weeks now. Pain Assessment Currently in Pain?: No/denies  Sensation/Coordination/Flexibility/Functional Tests Functional Tests Functional Tests: - Hallpike-Dix   Assessment RLE Strength Right Hip Flexion: 5/5 (was 4/5) Right Hip Extension: 5/5 (was 3+/5) Right Hip ABduction: 3/5 Right Knee Flexion: 5/5 (was 4/5) Right Knee Extension: 5/5 (was 3+/5) Right Ankle Dorsiflexion: 4/5 ( was 4-/5) LLE Strength Left Hip Flexion: 4/5 (was 3/5) Left Hip Extension: 3/5 (was 2+/5) Left Hip ABduction: 4/5 (was 4/5) Left Knee Flexion: 3+/5 Left Knee Extension: 5/5 (was 4/5) Left Ankle Dorsiflexion:  (4-/5 was 4-/5)  Exercise/Treatments Mobility/Balance  Berg Balance Test Sit to Stand: Able to stand without using hands and stabilize independently Standing Unsupported: Able to stand safely 2 minutes Sitting with Back Unsupported but Feet Supported on Floor or Stool: Able to sit safely and securely 2 minutes Stand to Sit: Controls descent by using hands Transfers: Able to transfer safely, definite need of hands Standing Unsupported with Eyes Closed: Able to stand 10 seconds safely Standing Ubsupported with Feet Together: Able to place feet together  independently and stand 1 minute safely From Standing, Reach Forward with Outstretched Arm: Can reach forward >12 cm safely (5") From Standing Position, Pick up Object from Floor: Able to pick up shoe safely and easily From Standing Position, Turn to Look Behind Over each Shoulder: Looks behind one side only/other side shows less weight shift Turn 360 Degrees: Able to turn 360 degrees safely in 4 seconds or less Standing Unsupported, Alternately Place Feet on Step/Stool: Able to stand independently and safely and complete 8 steps in 20 seconds Standing Unsupported, One Foot in Front: Able to plae foot ahead of the other independently and hold 30 seconds Standing on One Leg: Able to lift leg independently and hold equal to or more than 3 seconds Total Score: 49 Timed Up and Go Test TUG: Normal TUG Normal TUG (seconds): 12 (was 18)    exercises:  B hip abduction x 10; Lt  hip extension x 10, knee flexion B x 10, ankle Dorsiflexion x 10; SLS R x 3 sec L x 1 x 3 each.  Physical Therapy Assessment and Plan PT Assessment and Plan Clinical Impression Statement: Pt states she has improved 40% with therapy.  Pt states she is doing her exercises at home and feel confident.  Pt has made  good gains in mm strength and balance.   PT Plan: discharge to HEP    Goals Home Exercise Program Pt will Perform Home Exercise Program: Independently PT Goal: Perform Home Exercise Program - Progress: Met PT Short Term Goals PT Short Term Goal 1 - Progress: Met PT Long Term Goals PT Long  Term Goal 1: Berg test improved 10 pts. PT Long Term Goal 1 - Progress: Progressing toward goal (improved 8 points) PT Long Term Goal 2: no falls PT Long Term Goal 2 - Progress: Met Long Term Goal 3: I in advance HEP Long Term Goal 3 Progress: Met Long Term Goal 4: TUG to be 14 sec or less to decrease risk of falling Long Term Goal 4 Progress: Met PT Long Term Goal 5: strength iimproved one grade to reduce risk of  falling. Long Term Goal 5 Progress: Progressing toward goal (majoity of mm have improved one grade.  see mm test)  Problem List Patient Active Problem List  Diagnosis  . Hx of falling, presenting hazards to health  . Bilateral leg weakness    PT - End of Session Activity Tolerance: Patient tolerated treatment well General Behavior During Session: [facility] for tasks performed Cognition: [facility] for tasks performed PT Plan of Care PT Home Exercise Plan: new exercise sheet given focusing on weakened mm.  GP Functional Assessment Tool Used: clinical judgement Functional Limitation: Self care Self Care Goal Status (A5409): At least 1 percent but less than 20 percent impaired, limited or restricted Self Care Discharge Status [phone]): At least 1 percent but less than 20 percent impaired, limited or restricted  Ralene Gasparyan,[name] 10/18/2012, 4:05 PM  Physician Documentation Your signature is required to indicate approval of the treatment plan as stated above.  Please sign and either send electronically or make a copy of this report for your files and return this physician signed original.   Please mark one 1.__approve of plan  2. ___approve of plan with the following conditions.   ______________________________                                                          _____________________ Physician Signature                                                                                                             Date

## 2014-06-16 ENCOUNTER — Observation Stay (HOSPITAL_COMMUNITY)
Admission: EM | Admit: 2014-06-16 | Discharge: 2014-06-18 | Disposition: A | Discharge status: Home / Self Care | Payer: Medicare Other | Attending: Family Medicine | Admitting: Family Medicine

## 2014-06-16 ENCOUNTER — Encounter (HOSPITAL_COMMUNITY): Payer: Self-pay | Admitting: *Deleted

## 2014-06-16 DIAGNOSIS — Z87440 Personal history of urinary (tract) infections: Secondary | ICD-10-CM | POA: Insufficient documentation

## 2014-06-16 DIAGNOSIS — Z79899 Other long term (current) drug therapy: Secondary | ICD-10-CM | POA: Insufficient documentation

## 2014-06-16 DIAGNOSIS — I10 Essential (primary) hypertension: Secondary | ICD-10-CM | POA: Diagnosis not present

## 2014-06-16 DIAGNOSIS — R112 Nausea with vomiting, unspecified: Secondary | ICD-10-CM | POA: Diagnosis present

## 2014-06-16 DIAGNOSIS — I491 Atrial premature depolarization: Secondary | ICD-10-CM | POA: Diagnosis not present

## 2014-06-16 DIAGNOSIS — Z880 Allergy status to penicillin: Secondary | ICD-10-CM | POA: Diagnosis not present

## 2014-06-16 DIAGNOSIS — E876 Hypokalemia: Secondary | ICD-10-CM | POA: Diagnosis present

## 2014-06-16 MED ORDER — ONDANSETRON HCL 4 MG/2ML IJ SOLN
4.0000 mg | Freq: Once | INTRAMUSCULAR | Status: AC
  Administered 2014-06-16: 4 mg via INTRAVENOUS
  Filled 2014-06-16: qty 2

## 2014-06-16 NOTE — ED Notes (Signed)
Family at bedside. Placed patient in gown and on cardiac monitoring at this time. Patient still vomiting also.

## 2014-06-16 NOTE — ED Notes (Signed)
Pt to department via EMS.  Per pt, she was working in the yard most of the day and then began feeling nauseated and vomited following dinner this evening.  Pt reports vomiting dark emesis since about 7 pm.

## 2014-06-17 ENCOUNTER — Encounter (HOSPITAL_COMMUNITY): Payer: Self-pay | Admitting: *Deleted

## 2014-06-17 DIAGNOSIS — I491 Atrial premature depolarization: Secondary | ICD-10-CM

## 2014-06-17 DIAGNOSIS — R112 Nausea with vomiting, unspecified: Secondary | ICD-10-CM | POA: Diagnosis not present

## 2014-06-17 DIAGNOSIS — I10 Essential (primary) hypertension: Secondary | ICD-10-CM | POA: Insufficient documentation

## 2014-06-17 DIAGNOSIS — R110 Nausea: Secondary | ICD-10-CM

## 2014-06-17 DIAGNOSIS — E876 Hypokalemia: Secondary | ICD-10-CM

## 2014-06-17 LAB — BASIC METABOLIC PANEL
ANION GAP: 17 — AB (ref 5–15)
BUN: 17 mg/dL (ref 6–23)
CHLORIDE: 94 meq/L — AB (ref 96–112)
CO2: 30 mEq/L (ref 19–32)
Calcium: 9.9 mg/dL (ref 8.4–10.5)
Creatinine, Ser: 0.93 mg/dL (ref 0.50–1.10)
GFR calc Af Amer: 63 mL/min — ABNORMAL LOW (ref >90)
GFR calc non Af Amer: 54 mL/min — ABNORMAL LOW (ref >90)
Glucose, Bld: 201 mg/dL — ABNORMAL HIGH (ref 70–99)
POTASSIUM: 3.6 meq/L — AB (ref 3.7–5.3)
SODIUM: 141 meq/L (ref 137–147)

## 2014-06-17 LAB — CBC WITH DIFFERENTIAL/PLATELET
BASOS PCT: 0 % (ref 0–1)
Basophils Absolute: 0 10*3/uL (ref 0.0–0.1)
EOS ABS: 0 10*3/uL (ref 0.0–0.7)
EOS PCT: 0 % (ref 0–5)
HCT: 40.1 % (ref 36.0–46.0)
Hemoglobin: 13.6 g/dL (ref 12.0–15.0)
LYMPHS ABS: 0.7 10*3/uL (ref 0.7–4.0)
Lymphocytes Relative: 7 % — ABNORMAL LOW (ref 12–46)
MCH: 28.5 pg (ref 26.0–34.0)
MCHC: 33.9 g/dL (ref 30.0–36.0)
MCV: 83.9 fL (ref 78.0–100.0)
MONOS PCT: 3 % (ref 3–12)
Monocytes Absolute: 0.3 10*3/uL (ref 0.1–1.0)
NEUTROS PCT: 90 % — AB (ref 43–77)
Neutro Abs: 9.3 10*3/uL — ABNORMAL HIGH (ref 1.7–7.7)
PLATELETS: 177 10*3/uL (ref 150–400)
RBC: 4.78 MIL/uL (ref 3.87–5.11)
RDW: 13.8 % (ref 11.5–15.5)
WBC: 10.4 10*3/uL (ref 4.0–10.5)

## 2014-06-17 LAB — COMPREHENSIVE METABOLIC PANEL
ALBUMIN: 3.9 g/dL (ref 3.5–5.2)
ALK PHOS: 62 U/L (ref 39–117)
ALT: 9 U/L (ref 0–35)
ANION GAP: 13 (ref 5–15)
AST: 12 U/L (ref 0–37)
BUN: 15 mg/dL (ref 6–23)
CALCIUM: 10.3 mg/dL (ref 8.4–10.5)
CO2: 34 mEq/L — ABNORMAL HIGH (ref 19–32)
Chloride: 92 mEq/L — ABNORMAL LOW (ref 96–112)
Creatinine, Ser: 0.88 mg/dL (ref 0.50–1.10)
GFR calc non Af Amer: 58 mL/min — ABNORMAL LOW (ref >90)
GFR, EST AFRICAN AMERICAN: 67 mL/min — AB (ref >90)
GLUCOSE: 174 mg/dL — AB (ref 70–99)
POTASSIUM: 3.3 meq/L — AB (ref 3.7–5.3)
SODIUM: 139 meq/L (ref 137–147)
TOTAL PROTEIN: 7.5 g/dL (ref 6.0–8.3)
Total Bilirubin: 0.4 mg/dL (ref 0.3–1.2)

## 2014-06-17 LAB — OCCULT BLOOD GASTRIC / DUODENUM (SPECIMEN CUP)
OCCULT BLOOD, GASTRIC: POSITIVE — AB
pH, Gastric: 4

## 2014-06-17 LAB — MAGNESIUM: MAGNESIUM: 2.1 mg/dL (ref 1.5–2.5)

## 2014-06-17 MED ORDER — POTASSIUM CHLORIDE 10 MEQ/100ML IV SOLN
10.0000 meq | INTRAVENOUS | Status: AC
  Administered 2014-06-17 (×3): 10 meq via INTRAVENOUS
  Filled 2014-06-17 (×3): qty 100

## 2014-06-17 MED ORDER — SODIUM CHLORIDE 0.9 % IV BOLUS (SEPSIS)
500.0000 mL | Freq: Once | INTRAVENOUS | Status: AC
  Administered 2014-06-17: 500 mL via INTRAVENOUS

## 2014-06-17 MED ORDER — ALUM & MAG HYDROXIDE-SIMETH 200-200-20 MG/5ML PO SUSP: 30.0000 mL | Freq: Four times a day (QID) | ORAL | Status: DC | PRN

## 2014-06-17 MED ORDER — POTASSIUM CHLORIDE IN NACL 20-0.9 MEQ/L-% IV SOLN
INTRAVENOUS | Status: AC
  Administered 2014-06-17: 07:00:00 via INTRAVENOUS

## 2014-06-17 MED ORDER — PROMETHAZINE HCL 12.5 MG PO TABS: 12.5000 mg | ORAL_TABLET | Freq: Four times a day (QID) | ORAL | Status: DC | PRN

## 2014-06-17 MED ORDER — SODIUM CHLORIDE 0.9 % IV SOLN
INTRAVENOUS | Status: AC
  Administered 2014-06-17: 05:00:00 via INTRAVENOUS

## 2014-06-17 MED ORDER — SODIUM CHLORIDE 0.9 % IJ SOLN
3.0000 mL | Freq: Two times a day (BID) | INTRAMUSCULAR | Status: DC
  Administered 2014-06-17 – 2014-06-18 (×2): 3 mL via INTRAVENOUS

## 2014-06-17 MED ORDER — PROMETHAZINE HCL 25 MG/ML IJ SOLN
12.5000 mg | Freq: Once | INTRAMUSCULAR | Status: AC
  Administered 2014-06-17: 12.5 mg via INTRAVENOUS
  Filled 2014-06-17: qty 1

## 2014-06-17 NOTE — H&P (Signed)
PCP:   Alice ReichertMCINNIS,ANGUS G, MD   Chief Complaint:  n/v  HPI: 80 yo female with multiple episodes of vomiting since 7pm.  Sudden onset was working in her yard.  Very active lives alone.  No diarrhea.  No fevers.  Denies any pain.  Has had several rounds of phenergan that help for a couple of hours then her vomiting returns.    Review of Systems:  Positive and negative as per HPI otherwise all other systems are negative  Past Medical History: Past Medical History  Diagnosis Date  . Hypertension   . UTI (lower urinary tract infection)    Past Surgical History  Procedure Laterality Date  . Appendectomy      Medications: Prior to Admission medications   Medication Sig Start Date End Date Taking? Authorizing Provider  lisinopril-hydrochlorothiazide (PRINZIDE,ZESTORETIC) 20-12.5 MG per tablet Take 1 tablet by mouth daily.    Historical Provider, MD  Multiple Vitamin (MULTIVITAMIN) tablet Take 1 tablet by mouth daily.    Historical Provider, MD  omeprazole (PRILOSEC OTC) 20 MG tablet Take 20 mg by mouth daily.    Historical Provider, MD    Allergies:   Allergies  Allergen Reactions  . Penicillins Hives and Rash    Social History:  reports that she has never smoked. She does not have any smokeless tobacco history on file. She reports that she does not drink alcohol. Her drug history is not on file.  Family History: History reviewed. No pertinent family history.  Physical Exam: Filed Vitals:   06/17/14 0230 06/17/14 0231 06/17/14 0244 06/17/14 0456  BP: 115/99  115/99 92/75  Pulse: 109  99   Temp:      TempSrc:      Resp:  24 27 28   Height:      Weight:      SpO2: 95%  99% 95%   General appearance: alert, cooperative and no distress Head: Normocephalic, without obvious abnormality, atraumatic Eyes: negative Nose: Nares normal. Septum midline. Mucosa normal. No drainage or sinus tenderness. Neck: no JVD and supple, symmetrical, trachea midline Lungs: clear to  auscultation bilaterally Heart: regular rate and rhythm, S1, S2 normal, no murmur, click, rub or gallop Abdomen: soft, non-tender; bowel sounds normal; no masses,  no organomegaly Extremities: extremities normal, atraumatic, no cyanosis or edema Pulses: 2+ and symmetric Skin: Skin color, texture, turgor normal. No rashes or lesions Neurologic: Grossly normal   Labs on Admission:   Recent Labs  06/17/14 0136  NA 139  K 3.3*  CL 92*  CO2 34*  GLUCOSE 174*  BUN 15  CREATININE 0.88  CALCIUM 10.3    Recent Labs  06/17/14 0136  AST 12  ALT 9  ALKPHOS 62  BILITOT 0.4  PROT 7.5  ALBUMIN 3.9    Recent Labs  06/17/14 0136  WBC 10.4  NEUTROABS 9.3*  HGB 13.6  HCT 40.1  MCV 83.9  PLT 177   Radiological Exams on Admission: No results found.  Assessment/Plan  80 yo female with sudden onset of n/v with no abd pain  Principal Problem:   Nausea and vomiting in adult-  Likely viral gastroenteritis.  abd exam benign.  Supportive care with gentle ivf.    Active Problems:  Stable unless o/w noted   Hypertension   Hypokalemia-  Replete in ivf.  Ck mag level also.   PAC (premature atrial contraction)  obs on tele due to freq of pac's.  Full code.    Tenlee Wollin A 06/17/2014, 5:01 AM

## 2014-06-17 NOTE — ED Notes (Signed)
Hospitalist at bedside 

## 2014-06-17 NOTE — Progress Notes (Signed)
Nutrition Brief Note  Patient identified on the Malnutrition Screening Tool (MST) Report  Wt Readings from Last 15 Encounters:  06/17/14 147 lb 6.4 oz (66.86 kg)  09/01/12 145 lb (65.772 kg)   80 yo female with multiple episodes of vomiting since 7pm. Sudden onset was working in her yard. Very active lives alone. No diarrhea. No fevers. Denies any pain. Has had several rounds of phenergan that help for a couple of hours then her vomiting returns.   Wt has been stable over the past year. She has been admitted for observation.  Body mass index is 23.8 kg/(m^2). Patient meets criteria for normal weight range based on current BMI.   Current diet order is clear liquid, patient is consuming approximately n/a% of meals at this time. Labs and medications reviewed.   No nutrition interventions warranted at this time. If nutrition issues arise, please consult RD.    Dwayne Bulkley A. Mayford KnifeWilliams, RD, LDN Pager: [phone]

## 2014-06-17 NOTE — Plan of Care (Signed)
Problem: Consults Goal: General Medical Patient Education See Patient Education Module for specific education. Outcome: Completed/Met Date Met:  06/17/14

## 2014-06-17 NOTE — ED Notes (Signed)
MD at bedside. 

## 2014-06-17 NOTE — Progress Notes (Signed)
Inpatient Diabetes Program Recommendations  AACE/ADA: New Consensus Statement on Inpatient Glycemic Control (2013)  Target Ranges:  Prepandial:   less than 140 mg/dL      Peak postprandial:   less than 180 mg/dL (1-2 hours)      Critically ill patients:  140 - 180 mg/dL   Results for Priscilla Wu, Priscilla Wu (MRN [id number]) as of 06/17/2014 08:42  Ref. Range 06/17/2014 01:36 06/17/2014 06:22  Glucose Latest Range: 70-99 mg/dL 782174 (H) 956201 (H)   Diabetes history: No Outpatient Diabetes medications:NA Current orders for Inpatient glycemic control: None  Inpatient Diabetes Program Recommendations Correction (SSI): Please consider ordering CBGs with Novolog correction scale ACHS. HgbA1C: Please consider ordering an A1C to evaluate glycemic control over the past 2-3 months.  Thanks, Orlando PennerMarie Shacoria Latif, RN, MSN, CCRN, CDE Diabetes Coordinator Inpatient Diabetes Program [phone] (Team Pager) [phone] (AP office) [phone] [facility] office)

## 2014-06-17 NOTE — ED Provider Notes (Signed)
CSN: [id number]     Arrival date & time 06/16/14  2243 History   First MD Initiated Contact with Patient 06/17/14 0208     Chief Complaint  Patient presents with  . Vomiting      (Consider location/radiation/quality/duration/timing/severity/associated sxs/prior Treatment) HPI  This is an 80 year old female with complaint of nausea and vomiting since 7 PM. She has had multiple episodes. The emesis is dark brown. She has a "raw" feeling in her abdomen but denies frank abdominal pain. She denies diarrhea. She denies chest pain or shortness of breath. She has had some pain in her posterior neck which she has had in the past. She was given Zofran per protocol on arrival but continues to vomit.  Past Medical History  Diagnosis Date  . Hypertension   . UTI (lower urinary tract infection)    Past Surgical History  Procedure Laterality Date  . Appendectomy     History reviewed. No pertinent family history. History  Substance Use Topics  . Smoking status: Never Smoker   . Smokeless tobacco: Not on file  . Alcohol Use: No   OB History    No data available     Review of Systems  All other systems reviewed and are negative.   Allergies  Penicillins  Home Medications   Prior to Admission medications   Medication Sig Start Date End Date Taking? Authorizing Provider  lisinopril-hydrochlorothiazide (PRINZIDE,ZESTORETIC) 20-12.5 MG per tablet Take 1 tablet by mouth daily.    Historical Provider, MD  Multiple Vitamin (MULTIVITAMIN) tablet Take 1 tablet by mouth daily.    Historical Provider, MD  omeprazole (PRILOSEC OTC) 20 MG tablet Take 20 mg by mouth daily.    Historical Provider, MD   BP 123/68 mmHg  Pulse 60  Temp(Src) 97.8 F (36.6 C) (Oral)  Resp 26  Ht 5\' 6"  (1.676 m)  Wt 140 lb (63.504 kg)  BMI 22.61 kg/m2  SpO2 97%   Physical Exam General: Well-developed, well-nourished female in no acute distress; appearance consistent with age of record HENT: normocephalic;  atraumatic; edentulous Eyes: pupils equal, round and reactive to light; extraocular muscles intact; lens implants Neck: supple Heart: irregular rhythm (frequent PACs); no murmur heard Lungs: clear to auscultation bilaterally Abdomen: soft; nondistended; nontender; no masses or hepatosplenomegaly; bowel sounds present Extremities: arthritic changes; pulses normal Neurologic: Awake, alert and oriented; motor function intact in all extremities and symmetric; no facial droop; hard of hearing  Skin: Warm and dry Psychiatric: Normal mood and affect    ED Course  Procedures (including critical care time)   MDM    EKG Interpretation  Date/Time:  Tuesday June 17 2014 02:17:53 EST Ventricular Rate:  102 PR Interval:  202 QRS Duration: 87 QT Interval:  365 QTC Calculation: 475 R Axis:   5 Text Interpretation:  Sinus tachycardia Atrial premature complexes Baseline wander in lead(s) V3 No PACs seen previously Confirmed by Davon Folta  MD, Jonny RuizJOHN ([id number]) on 06/17/2014 2:35:55 AM       Nursing notes and vitals signs, including pulse oximetry, reviewed.  Summary of this visit's results, reviewed by myself:  Labs:  Results for orders placed or performed during the hospital encounter of 06/16/14 (from the past 24 hour(s))  CBC with Differential     Status: Abnormal   Collection Time: 06/17/14  1:36 AM  Result Value Ref Range   WBC 10.4 4.0 - 10.5 K/uL   RBC 4.78 3.87 - 5.11 MIL/uL   Hemoglobin 13.6 12.0 - 15.0 g/dL   HCT  40.1 36.0 - 46.0 %   MCV 83.9 78.0 - 100.0 fL   MCH 28.5 26.0 - 34.0 pg   MCHC 33.9 30.0 - 36.0 g/dL   RDW 60.413.8 54.011.5 - 98.115.5 %   Platelets 177 150 - 400 K/uL   Neutrophils Relative % 90 (H) 43 - 77 %   Neutro Abs 9.3 (H) 1.7 - 7.7 K/uL   Lymphocytes Relative 7 (L) 12 - 46 %   Lymphs Abs 0.7 0.7 - 4.0 K/uL   Monocytes Relative 3 3 - 12 %   Monocytes Absolute 0.3 0.1 - 1.0 K/uL   Eosinophils Relative 0 0 - 5 %   Eosinophils Absolute 0.0 0.0 - 0.7 K/uL   Basophils  Relative 0 0 - 1 %   Basophils Absolute 0.0 0.0 - 0.1 K/uL  Comprehensive metabolic panel     Status: Abnormal   Collection Time: 06/17/14  1:36 AM  Result Value Ref Range   Sodium 139 137 - 147 mEq/L   Potassium 3.3 (L) 3.7 - 5.3 mEq/L   Chloride 92 (L) 96 - 112 mEq/L   CO2 34 (H) 19 - 32 mEq/L   Glucose, Bld 174 (H) 70 - 99 mg/dL   BUN 15 6 - 23 mg/dL   Creatinine, Ser 1.910.88 0.50 - 1.10 mg/dL   Calcium 47.810.3 8.4 - 29.510.5 mg/dL   Total Protein 7.5 6.0 - 8.3 g/dL   Albumin 3.9 3.5 - 5.2 g/dL   AST 12 0 - 37 U/L   ALT 9 0 - 35 U/L   Alkaline Phosphatase 62 39 - 117 U/L   Total Bilirubin 0.4 0.3 - 1.2 mg/dL   GFR calc non Af Amer 58 (L) >90 mL/min   GFR calc Af Amer 67 (L) >90 mL/min   Anion gap 13 5 - 15   4:49 AM Nausea transiently improved with IV Phenergan but has returned.    Hanley SeamenJohn L Khalin Royce, MD 06/17/14 [phone]

## 2014-06-17 NOTE — ED Notes (Signed)
Pt placed on bed pan. Unable to void at present. Will attempt again.

## 2014-06-17 NOTE — Progress Notes (Signed)
Subjective: The patient is somewhat confused mentally. She's had no further episodes of vomiting. She was admitted with what is felt to be gastroenteritis with multiple episodes of vomiting. She does have a low serum potassium 3.3  Objective: Vital signs in last 24 hours: Temp:  [97.8 F (36.6 C)-97.9 F (36.6 C)] 97.9 F (36.6 C) (11/17 0600) Pulse Rate:  [60-109] 74 (11/17 0600) Resp:  [18-30] 20 (11/17 0600) BP: (92-123)/(61-99) 116/76 mmHg (11/17 0600) SpO2:  [94 %-99 %] 99 % (11/17 0600) Weight:  [63.504 kg (140 lb)-66.86 kg (147 lb 6.4 oz)] 66.86 kg (147 lb 6.4 oz) (11/17 0600) Weight change:  Last BM Date:  (Pt unable to recall)  Intake/Output from previous day:   Intake/Output this shift:    Physical Exam: General appearance-the patient is somewhat confused mentally  HEENT negative  Neck supple no JVD or thyroid abnormalities  Lungs clear to P&A  Heart regular rhythm no murmurs  Abdomen no palpable organs or masses no organomegaly  Extremities free of edema   Recent Labs  06/17/14 0136  WBC 10.4  HGB 13.6  HCT 40.1  PLT 177   BMET  Recent Labs  06/17/14 0136  NA 139  K 3.3*  CL 92*  CO2 34*  GLUCOSE 174*  BUN 15  CREATININE 0.88  CALCIUM 10.3    Studies/Results: No results found.  Medications:  . sodium chloride   Intravenous STAT  . potassium chloride  10 mEq Intravenous Q1 Hr x 3  . sodium chloride  3 mL Intravenous Q12H    . 0.9 % NaCl with KCl 20 mEq / L       Assessment/Plan: 1. Gastroenteritis nausea and vomiting-continue hydration  2. Low serum potassium will replete and recheck her chemistries  3. Hypertension essential-continue to monitor   LOS: 1 day   Monterio Bob G 06/17/2014, 6:21 AM

## 2014-06-17 NOTE — Care Management Note (Signed)
    Page 1 of 1   06/18/2014     2:35:12 PM CARE MANAGEMENT NOTE 06/18/2014  Patient:  Priscilla Wu,Priscilla Wu   Account Number:  [id number]  Date Initiated:  06/17/2014  Documentation initiated by:  Sharrie RothmanBLACKWELL,Moselle Rister Wu  Subjective/Objective Assessment:   Pt admitted from home with possible gastroenteritis. Pt lives alone and has a son that lives next door and 2 daughters who are very active in the care of the pt. Pt has a cane but states that she has been fairly independent with ADL's.     Action/Plan:   PT consult ordered. Will follow for discharge planning needs.   Anticipated DC Date:  06/19/2014   Anticipated DC Plan:  HOME/SELF CARE      DC Planning Services  CM consult      Choice offered to / List presented to:             Status of service:  Completed, signed off Medicare Important Message given?   (If response is "NO", the following Medicare IM given date fields will be blank) Date Medicare IM given:   Medicare IM given by:   Date Additional Medicare IM given:   Additional Medicare IM given by:    Discharge Disposition:  HOME/SELF CARE  Per UR Regulation:    If discussed at Long Length of Stay Meetings, dates discussed:    Comments:  06/18/14 1430 Arlyss Queenammy Jayshaun Phillips, RN BSN CM Pt discharged home today. PT consulted and did not recommend any folllowup. No furthur CM needs noted.  06/17/14 1445 Arlyss Queenammy Ronav Furney, RN BSN CM

## 2014-06-17 NOTE — ED Notes (Signed)
Pt given Sprite to drink as PO challenge. Instructed to notify staff if nausea and emesis return.

## 2014-06-18 DIAGNOSIS — R112 Nausea with vomiting, unspecified: Secondary | ICD-10-CM | POA: Diagnosis not present

## 2014-06-18 LAB — BASIC METABOLIC PANEL
ANION GAP: 11 (ref 5–15)
BUN: 27 mg/dL — ABNORMAL HIGH (ref 6–23)
CHLORIDE: 101 meq/L (ref 96–112)
CO2: 24 meq/L (ref 19–32)
Calcium: 8.9 mg/dL (ref 8.4–10.5)
Creatinine, Ser: 0.96 mg/dL (ref 0.50–1.10)
GFR calc Af Amer: 60 mL/min — ABNORMAL LOW (ref >90)
GFR calc non Af Amer: 52 mL/min — ABNORMAL LOW (ref >90)
Glucose, Bld: 93 mg/dL (ref 70–99)
POTASSIUM: 4.2 meq/L (ref 3.7–5.3)
SODIUM: 136 meq/L — AB (ref 137–147)

## 2014-06-18 LAB — CBC
HEMATOCRIT: 33.4 % — AB (ref 36.0–46.0)
HEMOGLOBIN: 10.9 g/dL — AB (ref 12.0–15.0)
MCH: 27.9 pg (ref 26.0–34.0)
MCHC: 32.6 g/dL (ref 30.0–36.0)
MCV: 85.6 fL (ref 78.0–100.0)
Platelets: 177 10*3/uL (ref 150–400)
RBC: 3.9 MIL/uL (ref 3.87–5.11)
RDW: 14.4 % (ref 11.5–15.5)
WBC: 10.2 10*3/uL (ref 4.0–10.5)

## 2014-06-18 LAB — URINALYSIS, ROUTINE W REFLEX MICROSCOPIC
Bilirubin Urine: NEGATIVE
Glucose, UA: NEGATIVE mg/dL
Hgb urine dipstick: NEGATIVE
KETONES UR: NEGATIVE mg/dL
LEUKOCYTES UA: NEGATIVE
NITRITE: NEGATIVE
PROTEIN: NEGATIVE mg/dL
Specific Gravity, Urine: 1.01 (ref 1.005–1.030)
UROBILINOGEN UA: 0.2 mg/dL (ref 0.0–1.0)
pH: 6 (ref 5.0–8.0)

## 2014-06-18 NOTE — Plan of Care (Signed)
Problem: Consults Goal: Skin Care Protocol Initiated - if Braden Score 18 or less If consults are not indicated, leave blank or document N/A  Outcome: Completed/Met Date Met:  06/18/14     

## 2014-06-18 NOTE — Evaluation (Signed)
Physical Therapy Evaluation Patient Details Name: Priscilla Wu MRN: [id number] DOB: [birth year] Today's Date: 06/18/2014   History of Present Illness  80 yo female with multiple episodes of vomiting since 7pm.  Sudden onset was working in her yard.  Very active lives alone.  No diarrhea.  No fevers.  Denies any pain.  Has had several rounds of phenergan that help for a couple of hours then her vomiting returns.    Clinical Impression  Pt is an 80 year old female who presents to PT with dx of N/V.  Pt reports she is much improved this morning, without complaints of N/V or any pain.  During evaluation, pt was (I) with bed mobility skills, and mod (I) for longer distance gait.  Pt was able to amb in room to bathroom/sink with supervision and without AD, and in hallways for 450 feet with use of RW.  No LOB or unsteadiness with and without AD.  Pt appears at baseline level of function and will be d/c from acute PT services.  Recommended to pt to use RW at discharge until activity tolerance improves, and pt stated her daughter had one she could borrow.      Follow Up Recommendations No PT follow up    Equipment Recommendations  Other (comment) (Pt would benefit from use of RW, though pt states her daughter has one she cane borrow.  If not, recommend use of RW for gait until pt improves. )       Precautions / Restrictions Precautions Precautions: Fall Restrictions Weight Bearing Restrictions: No      Mobility  Bed Mobility Overal bed mobility: Independent                Transfers Overall transfer level: Modified independent Equipment used: Rolling walker (2 wheeled)                Ambulation/Gait Ambulation/Gait assistance: Modified independent (Device/Increase time);Supervision Ambulation Distance (Feet): 450 Feet Assistive device: Rolling walker (2 wheeled) Gait Pattern/deviations: Step-through pattern;Decreased stride length   Gait velocity interpretation: at or  above normal speed for age/gender General Gait Details: Pt requested use of RW for transfers/gait as she had not been out of bed in a few days.  VC initially for technique (as pt tried to pick up walker and stayed to far away).  Pt was able to amb in room (to bathroom/sink) without use of AD. No LOB with gait with RW or without.      Balance Overall balance assessment: No apparent balance deficits (not formally assessed)                                           Pertinent Vitals/Pain Pain Assessment: No/denies pain    Home Living Family/patient expects to be discharged to:: Private residence Living Arrangements: Alone Available Help at Discharge: Family;Available 24 hours/day (Son and daughter in law live nearby and able to help 24/7 if needed, or PRN) Type of Home: House Home Access: Stairs to enter Entrance Stairs-Rails: Can reach both Entrance Stairs-Number of Steps: 2 steps to porch/front door without rail, 3 steps to back door with (B) rails Home Layout: One level Home Equipment: Grab bars - toilet;Cane - single point Additional Comments: Tub Shower    Prior Function Level of Independence: Independent with assistive device(s)         Comments: Pt reports (I) with bed  mobility skills, transfers, and household amb, and use of std cane for amb outside the home.         Extremity/Trunk Assessment               Lower Extremity Assessment: Overall WFL for tasks assessed         Communication   Communication: No difficulties  Cognition Arousal/Alertness: Awake/alert Behavior During Therapy: WFL for tasks assessed/performed Overall Cognitive Status: Within Functional Limits for tasks assessed                       Assessment/Plan    PT Assessment Patent does not need any further PT services  PT Diagnosis Difficulty walking   PT Problem List    PT Treatment Interventions     PT Goals (Current goals can be found in the Care Plan  section) Acute Rehab PT Goals PT Goal Formulation: All assessment and education complete, DC therapy     End of Session Equipment Utilized During Treatment: Gait belt Activity Tolerance: Patient tolerated treatment well Patient left: in bed;with call bell/phone within reach;with bed alarm set      Functional Assessment Tool Used: Clinical Judgement Functional Limitation: Mobility: Walking and moving around Mobility: Walking and Moving Around Current Status ([id number]): At least 1 percent but less than 20 percent impaired, limited or restricted Mobility: Walking and Moving Around Goal Status [phone]): At least 1 percent but less than 20 percent impaired, limited or restricted Mobility: Walking and Moving Around Discharge Status [phone]): At least 1 percent but less than 20 percent impaired, limited or restricted    Time: 0851-0908 PT Time Calculation (min) (ACUTE ONLY): 17 min   Charges:   PT Evaluation $Initial PT Evaluation Tier I: 1 Procedure     PT G Codes:   Functional Assessment Tool Used: Clinical Judgement Functional Limitation: Mobility: Walking and moving around    Bays[facility] HospitalWOODWORTH,Priscilla Wu 06/18/2014, 9:54 AM

## 2014-06-18 NOTE — Progress Notes (Signed)
Subjective: The patient is much more alert today. She's had no further episodes of vomiting. She was admitted with what was felt to be gastroenteritis and did have low serum potassium  Objective: Vital signs in last 24 hours: Temp:  [97.9 F (36.6 C)-98.3 F (36.8 C)] 97.9 F (36.6 C) (11/18 0530) Pulse Rate:  [79-102] 91 (11/18 0530) Resp:  [20-22] 21 (11/18 0530) BP: (89-109)/(56-62) 95/56 mmHg (11/18 0530) SpO2:  [96 %-100 %] 96 % (11/18 0530) Weight:  [68.176 kg (150 lb 4.8 oz)] 68.176 kg (150 lb 4.8 oz) (11/18 0530) Weight change: 4.672 kg (10 lb 4.8 oz) Last BM Date: 06/09/14  Intake/Output from previous day: 11/17 0701 - 11/18 0700 In: 1146.3 [P.O.:240; I.V.:606.3; IV Piggyback:300] Out: 900 [Urine:900] Intake/Output this shift: Total I/O In: -  Out: 900 [Urine:900]  Physical Exam: Gen. Appearance-the patient is alert  H EENT negative  Neck supple no JVD or thyroid abnormalities  Lungs clear to P&A  Heart regular rhythm no murmurs  Abdomen no palpable organs or masses no organomegaly  Extremities free of edema   Recent Labs  06/17/14 0136  WBC 10.4  HGB 13.6  HCT 40.1  PLT 177   BMET  Recent Labs  06/17/14 0136 06/17/14 0622  NA 139 141  K 3.3* 3.6*  CL 92* 94*  CO2 34* 30  GLUCOSE 174* 201*  BUN 15 17  CREATININE 0.88 0.93  CALCIUM 10.3 9.9    Studies/Results: No results found.  Medications:  . sodium chloride  3 mL Intravenous Q12H        Assessment/Plan: 1. Gastroenteritis with nausea and vomiting-continue hydration and continue to advance diet  2. Low serum potassium which is been repleted we'll recheck chemistries  3. Hypertension essential continue to monitor    LOS: 2 days   Kaisyn Millea G 06/18/2014, 6:13 AM

## 2014-06-18 NOTE — Discharge Planning (Signed)
Pt [facility] family [facility] and [facility]  Both IV's and tele removed.  Pt and family educated on how to treat N/V and ways to protect against risks of dehydration.  Pt will be wheeled to car by myself and family when ready.

## 2014-06-18 NOTE — Discharge Summary (Signed)
Physician Discharge Summary  Moshe CiproHarriette C Messing ZOX:[id number] DOB: 30-Oct-1925 DOA: 06/16/2014  PCP: Alice ReichertMCINNIS,Zeyad Delaguila G, MD  Admit date: 06/16/2014 Discharge date: 06/18/2014     Discharge Diagnoses:  1. Gastroenteritis/ secondary to virus with mild dehydration 2. Low serum potassium 3. Essential hypertension Discharge Condition: Stable Disposition: Home Diet recommendation: Regular diet  Filed Weights   06/16/14 2247 06/17/14 0600 06/18/14 0530  Weight: 63.504 kg (140 lb) 66.86 kg (147 lb 6.4 oz) 68.176 kg (150 lb 4.8 oz)    History of present illness:  The patient was admitted through the emergency department after having presented there with a history of multiple episodes of vomiting. She is started on intravenous fluids and emergency department and subsequently was admitted  Hospital Course:  The patient was given Zofran per protocol on arrival to emergency department but continued to vomit she started on intravenous fluids and admitted to MedSurg floor. Pertinent  physical findings vital signs were stable, heart irregular rhythm with premature beats abdomen no palpable organs or masses no tenderness. EKGs showed evidence of sinus tachycardia with atrial premature complexes. Patient was continued on intravenous fluids she rapidly became better after reaching the floor. She was given clear liquids to begin with but her diet was advanced she became asymptomatic and was felt she could be discharged. Her chemistries remained normal with exception of slightly low potassium. This was corrected with potassium supplement.  Discharge Instructions The patient should continue to advance diet and continue medications listed below. She is asked to call her primary care physician and follow-up with appointment within the next few days.    Medication List    ASK your doctor about these medications        lisinopril-hydrochlorothiazide 20-12.5 MG per tablet  Commonly known as:   PRINZIDE,ZESTORETIC  Take 1 tablet by mouth daily.     multivitamin tablet  Take 1 tablet by mouth daily.     omeprazole 20 MG tablet  Commonly known as:  PRILOSEC OTC  Take 20 mg by mouth daily.       Allergies  Allergen Reactions  . Penicillins Hives and Rash    The results of significant diagnostics from this hospitalization (including imaging, microbiology, ancillary and laboratory) are listed below for reference.    Significant Diagnostic Studies: No results found.  Microbiology: No results found for this or any previous visit (from the past 240 hour(s)).   Labs: Basic Metabolic Panel:  Recent Labs Lab 06/17/14 0136 06/17/14 0617 06/17/14 0622 06/18/14 0638  NA 139  --  141 136*  K 3.3*  --  3.6* 4.2  CL 92*  --  94* 101  CO2 34*  --  30 24  GLUCOSE 174*  --  201* 93  BUN 15  --  17 27*  CREATININE 0.88  --  0.93 0.96  CALCIUM 10.3  --  9.9 8.9  MG  --  2.1  --   --    Liver Function Tests:  Recent Labs Lab 06/17/14 0136  AST 12  ALT 9  ALKPHOS 62  BILITOT 0.4  PROT 7.5  ALBUMIN 3.9   No results for input(s): LIPASE, AMYLASE in the last 168 hours. No results for input(s): AMMONIA in the last 168 hours. CBC:  Recent Labs Lab 06/17/14 0136 06/18/14 0638  WBC 10.4 10.2  NEUTROABS 9.3*  --   HGB 13.6 10.9*  HCT 40.1 33.4*  MCV 83.9 85.6  PLT 177 177   Cardiac Enzymes: No results for input(s):  CKTOTAL, CKMB, CKMBINDEX, TROPONINI in the last 168 hours. BNP: BNP (last 3 results) No results for input(s): PROBNP in the last 8760 hours. CBG: No results for input(s): GLUCAP in the last 168 hours.  Principal Problem:   Nausea and vomiting in adult Active Problems:   Hypertension   Hypokalemia   PAC (premature atrial contraction)   Time coordinating discharge: 30 minutes  Signed:  Butch PennyAngus Graycen Degan, MD 06/18/2014, 1:24 PM

## 2014-06-18 NOTE — Plan of Care (Signed)
Dr. Jeanene Erballed about progressing diet for poss DC.  Gave orders for soft diet then if tolerated  - advance to Regular.  Dr. Sheppard EvensIndicated that [facility] if tolerates [facility].

## 2014-06-18 NOTE — Plan of Care (Signed)
Problem: Phase I Progression Outcomes Goal: Pain controlled with appropriate interventions Outcome: Completed/Met Date Met:  28-Nov-2013

## 2015-08-12 ENCOUNTER — Emergency Department (HOSPITAL_COMMUNITY): Payer: Medicare Other

## 2015-08-12 ENCOUNTER — Encounter (HOSPITAL_COMMUNITY): Payer: Self-pay | Admitting: *Deleted

## 2015-08-12 ENCOUNTER — Inpatient Hospital Stay (HOSPITAL_COMMUNITY)
Admission: EM | Admit: 2015-08-12 | Discharge: 2015-09-02 | DRG: 329 | Disposition: E | Payer: Medicare Other | Attending: Internal Medicine | Admitting: Internal Medicine

## 2015-08-12 DIAGNOSIS — E43 Unspecified severe protein-calorie malnutrition: Secondary | ICD-10-CM | POA: Insufficient documentation

## 2015-08-12 DIAGNOSIS — R571 Hypovolemic shock: Secondary | ICD-10-CM | POA: Diagnosis not present

## 2015-08-12 DIAGNOSIS — K565 Intestinal adhesions [bands] with obstruction (postprocedural) (postinfection): Principal | ICD-10-CM | POA: Diagnosis present

## 2015-08-12 DIAGNOSIS — Z7189 Other specified counseling: Secondary | ICD-10-CM | POA: Diagnosis not present

## 2015-08-12 DIAGNOSIS — Z7401 Bed confinement status: Secondary | ICD-10-CM

## 2015-08-12 DIAGNOSIS — R0602 Shortness of breath: Secondary | ICD-10-CM

## 2015-08-12 DIAGNOSIS — Z515 Encounter for palliative care: Secondary | ICD-10-CM | POA: Insufficient documentation

## 2015-08-12 DIAGNOSIS — I82622 Acute embolism and thrombosis of deep veins of left upper extremity: Secondary | ICD-10-CM | POA: Diagnosis not present

## 2015-08-12 DIAGNOSIS — J90 Pleural effusion, not elsewhere classified: Secondary | ICD-10-CM

## 2015-08-12 DIAGNOSIS — I4891 Unspecified atrial fibrillation: Secondary | ICD-10-CM

## 2015-08-12 DIAGNOSIS — D62 Acute posthemorrhagic anemia: Secondary | ICD-10-CM | POA: Diagnosis not present

## 2015-08-12 DIAGNOSIS — K922 Gastrointestinal hemorrhage, unspecified: Secondary | ICD-10-CM | POA: Diagnosis present

## 2015-08-12 DIAGNOSIS — T502X5A Adverse effect of carbonic-anhydrase inhibitors, benzothiadiazides and other diuretics, initial encounter: Secondary | ICD-10-CM | POA: Diagnosis not present

## 2015-08-12 DIAGNOSIS — Z95828 Presence of other vascular implants and grafts: Secondary | ICD-10-CM

## 2015-08-12 DIAGNOSIS — J9601 Acute respiratory failure with hypoxia: Secondary | ICD-10-CM | POA: Diagnosis present

## 2015-08-12 DIAGNOSIS — E8770 Fluid overload, unspecified: Secondary | ICD-10-CM | POA: Diagnosis present

## 2015-08-12 DIAGNOSIS — Y828 Other medical devices associated with adverse incidents: Secondary | ICD-10-CM | POA: Diagnosis not present

## 2015-08-12 DIAGNOSIS — Z66 Do not resuscitate: Secondary | ICD-10-CM | POA: Diagnosis present

## 2015-08-12 DIAGNOSIS — R112 Nausea with vomiting, unspecified: Secondary | ICD-10-CM | POA: Diagnosis present

## 2015-08-12 DIAGNOSIS — K56609 Unspecified intestinal obstruction, unspecified as to partial versus complete obstruction: Secondary | ICD-10-CM | POA: Diagnosis present

## 2015-08-12 DIAGNOSIS — J811 Chronic pulmonary edema: Secondary | ICD-10-CM

## 2015-08-12 DIAGNOSIS — I10 Essential (primary) hypertension: Secondary | ICD-10-CM | POA: Diagnosis present

## 2015-08-12 DIAGNOSIS — R1110 Vomiting, unspecified: Secondary | ICD-10-CM | POA: Diagnosis not present

## 2015-08-12 DIAGNOSIS — D72829 Elevated white blood cell count, unspecified: Secondary | ICD-10-CM

## 2015-08-12 DIAGNOSIS — T82868A Thrombosis of vascular prosthetic devices, implants and grafts, initial encounter: Secondary | ICD-10-CM | POA: Diagnosis not present

## 2015-08-12 DIAGNOSIS — Z931 Gastrostomy status: Secondary | ICD-10-CM

## 2015-08-12 DIAGNOSIS — I959 Hypotension, unspecified: Secondary | ICD-10-CM

## 2015-08-12 DIAGNOSIS — E871 Hypo-osmolality and hyponatremia: Secondary | ICD-10-CM | POA: Diagnosis not present

## 2015-08-12 DIAGNOSIS — E876 Hypokalemia: Secondary | ICD-10-CM | POA: Diagnosis not present

## 2015-08-12 DIAGNOSIS — K449 Diaphragmatic hernia without obstruction or gangrene: Secondary | ICD-10-CM | POA: Diagnosis present

## 2015-08-12 DIAGNOSIS — D696 Thrombocytopenia, unspecified: Secondary | ICD-10-CM | POA: Diagnosis not present

## 2015-08-12 DIAGNOSIS — E860 Dehydration: Secondary | ICD-10-CM | POA: Diagnosis present

## 2015-08-12 DIAGNOSIS — R609 Edema, unspecified: Secondary | ICD-10-CM

## 2015-08-12 DIAGNOSIS — F22 Delusional disorders: Secondary | ICD-10-CM

## 2015-08-12 DIAGNOSIS — R601 Generalized edema: Secondary | ICD-10-CM | POA: Diagnosis not present

## 2015-08-12 DIAGNOSIS — N170 Acute kidney failure with tubular necrosis: Secondary | ICD-10-CM | POA: Diagnosis not present

## 2015-08-12 DIAGNOSIS — N179 Acute kidney failure, unspecified: Secondary | ICD-10-CM

## 2015-08-12 DIAGNOSIS — K859 Acute pancreatitis without necrosis or infection, unspecified: Secondary | ICD-10-CM | POA: Diagnosis present

## 2015-08-12 DIAGNOSIS — Z0189 Encounter for other specified special examinations: Secondary | ICD-10-CM

## 2015-08-12 HISTORY — DX: Headache, unspecified: R51.9

## 2015-08-12 HISTORY — DX: Headache: R51

## 2015-08-12 HISTORY — DX: Cervicalgia: M54.2

## 2015-08-12 LAB — URINALYSIS, ROUTINE W REFLEX MICROSCOPIC
GLUCOSE, UA: NEGATIVE mg/dL
Hgb urine dipstick: NEGATIVE
LEUKOCYTES UA: NEGATIVE
Nitrite: NEGATIVE
PROTEIN: 30 mg/dL — AB
Specific Gravity, Urine: >1.03 — ABNORMAL HIGH (ref 1.005–1.030)
pH: 5 (ref 5.0–8.0)

## 2015-08-12 LAB — URINE MICROSCOPIC-ADD ON

## 2015-08-12 LAB — CBC WITH DIFFERENTIAL/PLATELET
Basophils Absolute: 0 10*3/uL (ref 0.0–0.1)
Basophils Relative: 0 %
Eosinophils Absolute: 0 10*3/uL (ref 0.0–0.7)
Eosinophils Relative: 0 %
HEMATOCRIT: 44.8 % (ref 36.0–46.0)
HEMOGLOBIN: 14.8 g/dL (ref 12.0–15.0)
LYMPHS ABS: 1.5 10*3/uL (ref 0.7–4.0)
LYMPHS PCT: 7 %
MCH: 27.5 pg (ref 26.0–34.0)
MCHC: 33 g/dL (ref 30.0–36.0)
MCV: 83.3 fL (ref 78.0–100.0)
MONOS PCT: 6 %
Monocytes Absolute: 1.4 10*3/uL — ABNORMAL HIGH (ref 0.1–1.0)
NEUTROS ABS: 18.9 10*3/uL — AB (ref 1.7–7.7)
NEUTROS PCT: 87 %
Platelets: 273 10*3/uL (ref 150–400)
RBC: 5.38 MIL/uL — AB (ref 3.87–5.11)
RDW: 14 % (ref 11.5–15.5)
WBC: 21.7 10*3/uL — AB (ref 4.0–10.5)

## 2015-08-12 LAB — COMPREHENSIVE METABOLIC PANEL
ALBUMIN: 4.2 g/dL (ref 3.5–5.0)
ALT: 14 U/L (ref 14–54)
AST: 20 U/L (ref 15–41)
Alkaline Phosphatase: 76 U/L (ref 38–126)
Anion gap: 18 — ABNORMAL HIGH (ref 5–15)
BUN: 44 mg/dL — AB (ref 6–20)
CHLORIDE: 88 mmol/L — AB (ref 101–111)
CO2: 29 mmol/L (ref 22–32)
CREATININE: 2.32 mg/dL — AB (ref 0.44–1.00)
Calcium: 10.3 mg/dL (ref 8.9–10.3)
GFR calc Af Amer: 21 mL/min — ABNORMAL LOW (ref >60)
GFR calc non Af Amer: 18 mL/min — ABNORMAL LOW (ref >60)
GLUCOSE: 236 mg/dL — AB (ref 65–99)
POTASSIUM: 3.7 mmol/L (ref 3.5–5.1)
Sodium: 135 mmol/L (ref 135–145)
Total Bilirubin: 0.9 mg/dL (ref 0.3–1.2)
Total Protein: 8.1 g/dL (ref 6.5–8.1)

## 2015-08-12 LAB — LIPASE, BLOOD: Lipase: 82 U/L — ABNORMAL HIGH (ref 11–51)

## 2015-08-12 LAB — POC OCCULT BLOOD, ED: FECAL OCCULT BLD: NEGATIVE

## 2015-08-12 LAB — LACTIC ACID, PLASMA: Lactic Acid, Venous: 3.8 mmol/L (ref 0.5–2.0)

## 2015-08-12 LAB — TROPONIN I: Troponin I: <0.03 ng/mL (ref <0.031)

## 2015-08-12 MED ORDER — SODIUM CHLORIDE 0.9 % IV BOLUS (SEPSIS)
500.0000 mL | Freq: Once | INTRAVENOUS | Status: AC
  Administered 2015-08-12: 500 mL via INTRAVENOUS

## 2015-08-12 MED ORDER — SODIUM CHLORIDE 0.9 % IV SOLN
INTRAVENOUS | Status: DC
  Administered 2015-08-12 – 2015-08-16 (×6): via INTRAVENOUS

## 2015-08-12 MED ORDER — ONDANSETRON HCL 4 MG/2ML IJ SOLN
4.0000 mg | INTRAMUSCULAR | Status: DC | PRN
  Administered 2015-08-12: 4 mg via INTRAVENOUS
  Filled 2015-08-12 (×2): qty 2

## 2015-08-12 MED ORDER — FAMOTIDINE IN NACL 20-0.9 MG/50ML-% IV SOLN
20.0000 mg | Freq: Once | INTRAVENOUS | Status: AC
  Administered 2015-08-12: 20 mg via INTRAVENOUS
  Filled 2015-08-12: qty 50

## 2015-08-12 NOTE — ED Provider Notes (Signed)
CSN: [id number]     Arrival date & time 08/21/2015  2031 History   First MD Initiated Contact with Patient 08/03/2015 2141     Chief Complaint  Patient presents with  . Emesis      HPI  Pt was seen at 2145. Per pt's family and pt, c/o gradual onset and persistence of multiple intermittent episodes of N/V that began yesterday.  Describes emesis as "dark colored."  Pt states her abd is "sore from vomiting." Pt also c/o generalized chest "pain" since yesterday, as well as acute flair of her chronic neck "pain." Pt's family states pt lives alone and "just told us" about her symptoms today. Pt has been unable to tol PO due to her symptoms. Denies abd pain, no palpitations, no cough/SOB, no back pain, no fevers, no black or blood in stools, denies red blood in emesis. The symptoms have been associated with no other complaints. The patient has a significant history of similar symptoms previously, with prior admission for same.       Past Medical History  Diagnosis Date  . Hypertension   . UTI (lower urinary tract infection)   . Neck pain    Past Surgical History  Procedure Laterality Date  . Appendectomy      Social History  Substance Use Topics  . Smoking status: Never Smoker   . Smokeless tobacco: None  . Alcohol Use: No    Review of Systems ROS: Statement: All systems negative except as marked or noted in the HPI; Constitutional: Negative for fever and chills. ; ; Eyes: Negative for eye pain, redness and discharge. ; ; ENMT: Negative for ear pain, hoarseness, nasal congestion, sinus pressure and sore throat. ; ; Cardiovascular: +CP. Negative for palpitations, diaphoresis, dyspnea and peripheral edema. ; ; Respiratory: Negative for cough, wheezing and stridor. ; ; Gastrointestinal: +N/V, "dark emesis." Negative for diarrhea, abdominal pain, blood in stool, hematemesis, jaundice and rectal bleeding. . ; ; Genitourinary: Negative for dysuria, flank pain and hematuria. ; ; Musculoskeletal: +neck  pain. Negative for back pain. Negative for swelling and trauma.; ; Skin: Negative for pruritus, rash, abrasions, blisters, bruising and skin lesion.; ; Neuro: Negative for headache, lightheadedness and neck stiffness. Negative for weakness, altered level of consciousness , altered mental status, extremity weakness, paresthesias, involuntary movement, seizure and syncope.     Allergies  Penicillins  Home Medications   Prior to Admission medications   Medication Sig Start Date End Date Taking? Authorizing Provider  lisinopril-hydrochlorothiazide (PRINZIDE,ZESTORETIC) 20-12.5 MG per tablet Take 1 tablet by mouth daily.   Yes Historical Provider, MD  Multiple Vitamin (MULTIVITAMIN) tablet Take 1 tablet by mouth daily.    Historical Provider, MD  omeprazole (PRILOSEC OTC) 20 MG tablet Take 20 mg by mouth daily.    Historical Provider, MD   BP 112/63 mmHg  Pulse 87  Temp(Src) 97.7 F (36.5 C) (Temporal)  Resp 14  Ht 5' 0.5" (1.537 m)  Wt 140 lb (63.504 kg)  BMI 26.88 kg/m2  SpO2 100%   Patient Vitals for the past 24 hrs:  BP Temp Temp src Pulse Resp SpO2 Height Weight  08/23/2015 2314 112/63 mmHg - - 87 14 100 % - -  08/03/2015 2300 112/63 mmHg - - - (!) 34 - - -  08/21/2015 2256 96/61 mmHg - - - - - - -  08/18/2015 2230 (!) 61/50 mmHg - - (!) 50 (!) 27 94 % - -  08/26/2015 2207 (!) 83/51 mmHg - - 65 14  94 % - -  08/06/2015 2041 (!) 100/41 mmHg 97.7 F (36.5 C) Temporal (!) 42 20 99 % 5' 0.5" (1.537 m) 140 lb (63.504 kg)    Physical Exam 2150: Physical examination:  Nursing notes reviewed; Vital signs and O2 SAT reviewed;  Constitutional: Well developed, Well nourished, In no acute distress. Vomiting dark emesis on my arrival to exam room.; Head:  Normocephalic, atraumatic; Eyes: EOMI, PERRL, No scleral icterus; ENMT: Mouth and pharynx normal, Mucous membranes dry; Neck: Supple, Full range of motion, No lymphadenopathy; Cardiovascular: Tachycardic rate and rhythm, No gallop; Respiratory: Breath  sounds clear & equal bilaterally, No wheezes.  Speaking full sentences with ease, Normal respiratory effort/excursion; Chest: Nontender, Movement normal; Abdomen: Soft, Nontender, Nondistended, Normal bowel sounds. Rectal exam performed w/permission of pt and ED RN chaperone present.  Anal tone normal.  Non-tender, soft brown stool in rectal vault, heme neg.  No fissures, no external hemorrhoids, no palp masses.; Genitourinary: No CVA tenderness; Extremities: Pulses normal, No tenderness, No edema, No calf edema or asymmetry.; Neuro: AA&Ox3, +very HOH, otherwise major CN grossly intact.  Speech clear. No gross focal motor deficits in extremities.; Skin: Color normal, Warm, Dry.   ED Course  Procedures (including critical care time)  Labs Review  Imaging Review  I have personally reviewed and evaluated these images and lab results as part of my medical decision-making.   EKG Interpretation   Date/Time:  Wednesday August 12 2015 20:46:17 EST Ventricular Rate:  117 PR Interval:  208 QRS Duration: 70 QT Interval:  334 QTC Calculation: 465 R Axis:   8 Text Interpretation:  Sinus tachycardia with Premature atrial complexes  Inferior infarct , age undetermined Possible Anterior infarct , age  undetermined Baseline wander When compared with ECG of 06/17/2014 No  significant change was found Confirmed by [facility]  MD, Nicholos Johns [phone]) on  08/28/2015 9:47:11 PM      MDM  MDM Reviewed: previous chart, nursing note and vitals Reviewed previous: labs and ECG Interpretation: labs, ECG and x-ray Total time providing critical care: 30-74 minutes. This excludes time spent performing separately reportable procedures and services. Consults: admitting MD     CRITICAL CARE Performed by: Laray Anger Total critical care time: 35 minutes Critical care time was exclusive of separately billable procedures and treating other patients. Critical care was necessary to treat or prevent imminent or  life-threatening deterioration. Critical care was time spent personally by me on the following activities: development of treatment plan with patient and/or surrogate as well as nursing, discussions with consultants, evaluation of patient's response to treatment, examination of patient, obtaining history from patient or surrogate, ordering and performing treatments and interventions, ordering and review of laboratory studies, ordering and review of radiographic studies, pulse oximetry and re-evaluation of patient's condition.   Results for orders placed or performed during the hospital encounter of 08/10/2015  Comprehensive metabolic panel  Result Value Ref Range   Sodium 135 135 - 145 mmol/L   Potassium 3.7 3.5 - 5.1 mmol/L   Chloride 88 (L) 101 - 111 mmol/L   CO2 29 22 - 32 mmol/L   Glucose, Bld 236 (H) 65 - 99 mg/dL   BUN 44 (H) 6 - 20 mg/dL   Creatinine, Ser 6.04 (H) 0.44 - 1.00 mg/dL   Calcium 54.0 8.9 - 98.1 mg/dL   Total Protein 8.1 6.5 - 8.1 g/dL   Albumin 4.2 3.5 - 5.0 g/dL   AST 20 15 - 41 U/L   ALT 14 14 - 54  U/L   Alkaline Phosphatase 76 38 - 126 U/L   Total Bilirubin 0.9 0.3 - 1.2 mg/dL   GFR calc non Af Amer 18 (L) >60 mL/min   GFR calc Af Amer 21 (L) >60 mL/min   Anion gap 18 (H) 5 - 15  Lipase, blood  Result Value Ref Range   Lipase 82 (H) 11 - 51 U/L  Troponin I  Result Value Ref Range   Troponin I <0.03 <0.031 ng/mL  Lactic acid, plasma  Result Value Ref Range   Lactic Acid, Venous 3.8 (HH) 0.5 - 2.0 mmol/L  CBC with Differential  Result Value Ref Range   WBC 21.7 (H) 4.0 - 10.5 K/uL   RBC 5.38 (H) 3.87 - 5.11 MIL/uL   Hemoglobin 14.8 12.0 - 15.0 g/dL   HCT 16.1 09.6 - 04.5 %   MCV 83.3 78.0 - 100.0 fL   MCH 27.5 26.0 - 34.0 pg   MCHC 33.0 30.0 - 36.0 g/dL   RDW 40.9 81.1 - 91.4 %   Platelets 273 150 - 400 K/uL   Neutrophils Relative % 87 %   Neutro Abs 18.9 (H) 1.7 - 7.7 K/uL   Lymphocytes Relative 7 %   Lymphs Abs 1.5 0.7 - 4.0 K/uL   Monocytes  Relative 6 %   Monocytes Absolute 1.4 (H) 0.1 - 1.0 K/uL   Eosinophils Relative 0 %   Eosinophils Absolute 0.0 0.0 - 0.7 K/uL   Basophils Relative 0 %   Basophils Absolute 0.0 0.0 - 0.1 K/uL  POC occult blood, ED  Result Value Ref Range   Fecal Occult Bld NEGATIVE NEGATIVE    Dg Abd Acute W/chest 08/13/2015  CLINICAL DATA:  Nausea and vomiting.  Chest pain. EXAM: DG ABDOMEN ACUTE W/ 1V CHEST COMPARISON:  Chest radiograph 03/29/2005 FINDINGS: The heart is enlarged. There is a large hiatal hernia. It is difficult to differentiate the hiatal hernia from the heart borders, however both appear increased in size from prior exam. Chronic interstitial changes are again seen. Limited left basilar assessment due to soft tissue attenuation. Diffusely dilated small bowel loops in the central abdomen measure up to 4 cm. Minimal stool in the right colon. No evidence of free intra-abdominal air, however gaseous gastric distension within the hiatal hernia partially limits evaluation for free air. No radiopaque calculi. Bones significantly under mineralized. IMPRESSION: 1. Findings consistent with small-bowel obstruction. 2. Large hiatal hernia, with gaseous distention, likely secondary to bowel obstruction. 3. Cardiomegaly. Cardiac borders difficult to delineate from the adjacent hiatal hernia. Electronically Signed   By: Rubye Oaks M.D.   On: 08/13/2015 00:02    Results for LENAE, WHERLEY (MRN [id number]) as of August 17, 2015 23:45  Ref. Range 06/17/2014 01:36 06/17/2014 06:22 06/18/2014 06:38 08/07/2015 21:11  BUN Latest Ref Range: 6-20 mg/dL 15 17 27  (H) 44 (H)  Creatinine Latest Ref Range: 0.44-1.00 mg/dL 0.86 5.78 4.69 6.29 (H)    0020:  Judicious IVF boluses given for clinical dehydration, elevated SG on Udip, new ARF on labs, elevated lactic acid and transient hypotension. BP improved; repeat lactic acid pending.  Stool heme negative. Abd remains benign and pt denies abd pain. Pt does continue to c/o  nausea; will place NGT. Dx and testing d/w pt and family.  Questions answered.  Verb understanding, agreeable to admit.  T/C to [facility] Dr. Sharl Ma, case discussed, including:  HPI, pertinent PM/SHx, VS/PE, dx testing, ED course and treatment:  Agreeable to admit, requests to write temporary orders, obtain stepdown  bed to team APAdmits.     Samuel Jester, DO 08/15/15 1554

## 2015-08-12 NOTE — ED Notes (Signed)
CRITICAL VALUE ALERT  Critical value received: lactic acid 3.8  Date of notification:  08/17/2015  Time of notification: 2306  Critical value read back:yes  Nurse who received alert:  Bennetta LaosPenny Alvino Lechuga,rn  MD notified (1st page): [email or web address]  Time of first page:   MD notified (2nd page):  Time of second page:  Responding MD:  [email or web address]  Time MD responded:  2306

## 2015-08-12 NOTE — ED Notes (Signed)
Pt c/o chest pain with neck pain that started a few days ago, daughters had brought pt to er today due to vomiting,

## 2015-08-13 DIAGNOSIS — R571 Hypovolemic shock: Secondary | ICD-10-CM | POA: Diagnosis not present

## 2015-08-13 DIAGNOSIS — D62 Acute posthemorrhagic anemia: Secondary | ICD-10-CM | POA: Diagnosis not present

## 2015-08-13 DIAGNOSIS — Z515 Encounter for palliative care: Secondary | ICD-10-CM | POA: Diagnosis not present

## 2015-08-13 DIAGNOSIS — J9601 Acute respiratory failure with hypoxia: Secondary | ICD-10-CM | POA: Diagnosis not present

## 2015-08-13 DIAGNOSIS — D696 Thrombocytopenia, unspecified: Secondary | ICD-10-CM | POA: Diagnosis not present

## 2015-08-13 DIAGNOSIS — K449 Diaphragmatic hernia without obstruction or gangrene: Secondary | ICD-10-CM | POA: Diagnosis present

## 2015-08-13 DIAGNOSIS — Z66 Do not resuscitate: Secondary | ICD-10-CM | POA: Diagnosis present

## 2015-08-13 DIAGNOSIS — T82868A Thrombosis of vascular prosthetic devices, implants and grafts, initial encounter: Secondary | ICD-10-CM | POA: Diagnosis not present

## 2015-08-13 DIAGNOSIS — K922 Gastrointestinal hemorrhage, unspecified: Secondary | ICD-10-CM | POA: Diagnosis present

## 2015-08-13 DIAGNOSIS — K565 Intestinal adhesions [bands] with obstruction (postprocedural) (postinfection): Secondary | ICD-10-CM | POA: Diagnosis present

## 2015-08-13 DIAGNOSIS — I4891 Unspecified atrial fibrillation: Secondary | ICD-10-CM | POA: Diagnosis present

## 2015-08-13 DIAGNOSIS — N170 Acute kidney failure with tubular necrosis: Secondary | ICD-10-CM | POA: Diagnosis not present

## 2015-08-13 DIAGNOSIS — K5669 Other intestinal obstruction: Secondary | ICD-10-CM | POA: Diagnosis not present

## 2015-08-13 DIAGNOSIS — N179 Acute kidney failure, unspecified: Secondary | ICD-10-CM

## 2015-08-13 DIAGNOSIS — E43 Unspecified severe protein-calorie malnutrition: Secondary | ICD-10-CM | POA: Diagnosis present

## 2015-08-13 DIAGNOSIS — R110 Nausea: Secondary | ICD-10-CM | POA: Diagnosis not present

## 2015-08-13 DIAGNOSIS — K859 Acute pancreatitis without necrosis or infection, unspecified: Secondary | ICD-10-CM | POA: Diagnosis present

## 2015-08-13 DIAGNOSIS — Y828 Other medical devices associated with adverse incidents: Secondary | ICD-10-CM | POA: Diagnosis not present

## 2015-08-13 DIAGNOSIS — T502X5A Adverse effect of carbonic-anhydrase inhibitors, benzothiadiazides and other diuretics, initial encounter: Secondary | ICD-10-CM | POA: Diagnosis not present

## 2015-08-13 DIAGNOSIS — R1110 Vomiting, unspecified: Secondary | ICD-10-CM | POA: Diagnosis present

## 2015-08-13 DIAGNOSIS — R112 Nausea with vomiting, unspecified: Secondary | ICD-10-CM | POA: Diagnosis present

## 2015-08-13 DIAGNOSIS — D72829 Elevated white blood cell count, unspecified: Secondary | ICD-10-CM | POA: Diagnosis present

## 2015-08-13 DIAGNOSIS — E860 Dehydration: Secondary | ICD-10-CM | POA: Diagnosis present

## 2015-08-13 DIAGNOSIS — K56609 Unspecified intestinal obstruction, unspecified as to partial versus complete obstruction: Secondary | ICD-10-CM | POA: Diagnosis present

## 2015-08-13 DIAGNOSIS — I10 Essential (primary) hypertension: Secondary | ICD-10-CM

## 2015-08-13 DIAGNOSIS — I959 Hypotension, unspecified: Secondary | ICD-10-CM | POA: Diagnosis not present

## 2015-08-13 DIAGNOSIS — J90 Pleural effusion, not elsewhere classified: Secondary | ICD-10-CM | POA: Diagnosis present

## 2015-08-13 DIAGNOSIS — Z7401 Bed confinement status: Secondary | ICD-10-CM | POA: Diagnosis not present

## 2015-08-13 DIAGNOSIS — E876 Hypokalemia: Secondary | ICD-10-CM | POA: Diagnosis not present

## 2015-08-13 DIAGNOSIS — E871 Hypo-osmolality and hyponatremia: Secondary | ICD-10-CM | POA: Diagnosis not present

## 2015-08-13 DIAGNOSIS — E8770 Fluid overload, unspecified: Secondary | ICD-10-CM | POA: Diagnosis present

## 2015-08-13 DIAGNOSIS — R601 Generalized edema: Secondary | ICD-10-CM | POA: Diagnosis not present

## 2015-08-13 LAB — OCCULT BLOOD GASTRIC / DUODENUM (SPECIMEN CUP)
Occult Blood, Gastric: POSITIVE — AB
pH, Gastric: 7

## 2015-08-13 LAB — CBC
HCT: 37.7 % (ref 36.0–46.0)
HEMATOCRIT: 38.9 % (ref 36.0–46.0)
HEMATOCRIT: 37.8 % (ref 36.0–46.0)
HEMOGLOBIN: 12 g/dL (ref 12.0–15.0)
Hemoglobin: 12.3 g/dL (ref 12.0–15.0)
Hemoglobin: 12 g/dL (ref 12.0–15.0)
MCH: 26.5 pg (ref 26.0–34.0)
MCH: 26.8 pg (ref 26.0–34.0)
MCH: 27 pg (ref 26.0–34.0)
MCHC: 31.6 g/dL (ref 30.0–36.0)
MCHC: 31.8 g/dL (ref 30.0–36.0)
MCHC: 31.7 g/dL (ref 30.0–36.0)
MCV: 83.7 fL (ref 78.0–100.0)
MCV: 84.2 fL (ref 78.0–100.0)
MCV: 84.9 fL (ref 78.0–100.0)
PLATELETS: 199 10*3/uL (ref 150–400)
PLATELETS: 209 10*3/uL (ref 150–400)
Platelets: 195 10*3/uL (ref 150–400)
RBC: 4.65 MIL/uL (ref 3.87–5.11)
RBC: 4.48 MIL/uL (ref 3.87–5.11)
RBC: 4.45 MIL/uL (ref 3.87–5.11)
RDW: 14.1 % (ref 11.5–15.5)
RDW: 14.2 % (ref 11.5–15.5)
RDW: 14.3 % (ref 11.5–15.5)
WBC: 17.3 10*3/uL — AB (ref 4.0–10.5)
WBC: 15.8 10*3/uL — AB (ref 4.0–10.5)
WBC: 13.5 10*3/uL — AB (ref 4.0–10.5)

## 2015-08-13 LAB — PH, GASTRIC FLUID (GASTROCCULT CARD): pH, Gastric: 7

## 2015-08-13 LAB — COMPREHENSIVE METABOLIC PANEL
ALT: 11 U/L — AB (ref 14–54)
AST: 16 U/L (ref 15–41)
Albumin: 3.3 g/dL — ABNORMAL LOW (ref 3.5–5.0)
Alkaline Phosphatase: 59 U/L (ref 38–126)
Anion gap: 7 (ref 5–15)
BILIRUBIN TOTAL: 0.5 mg/dL (ref 0.3–1.2)
BUN: 48 mg/dL — AB (ref 6–20)
CALCIUM: 8.8 mg/dL — AB (ref 8.9–10.3)
CO2: 31 mmol/L (ref 22–32)
CREATININE: 1.72 mg/dL — AB (ref 0.44–1.00)
Chloride: 101 mmol/L (ref 101–111)
GFR, EST AFRICAN AMERICAN: 30 mL/min — AB (ref >60)
GFR, EST NON AFRICAN AMERICAN: 26 mL/min — AB (ref >60)
Glucose, Bld: 117 mg/dL — ABNORMAL HIGH (ref 65–99)
Potassium: 4.1 mmol/L (ref 3.5–5.1)
Sodium: 139 mmol/L (ref 135–145)
TOTAL PROTEIN: 6.4 g/dL — AB (ref 6.5–8.1)

## 2015-08-13 LAB — LACTIC ACID, PLASMA: Lactic Acid, Venous: 2.2 mmol/L (ref 0.5–2.0)

## 2015-08-13 LAB — MRSA PCR SCREENING: MRSA BY PCR: NEGATIVE

## 2015-08-13 MED ORDER — ONDANSETRON HCL 4 MG/2ML IJ SOLN: 4.0000 mg | Freq: Three times a day (TID) | INTRAMUSCULAR | Status: AC | PRN

## 2015-08-13 MED ORDER — ONDANSETRON HCL 4 MG/2ML IJ SOLN
4.0000 mg | Freq: Four times a day (QID) | INTRAMUSCULAR | Status: DC | PRN
  Administered 2015-08-14 – 2015-08-28 (×2): 4 mg via INTRAVENOUS
  Filled 2015-08-13 (×2): qty 2

## 2015-08-13 MED ORDER — ACETAMINOPHEN 650 MG RE SUPP: 650.0000 mg | Freq: Four times a day (QID) | RECTAL | Status: DC | PRN

## 2015-08-13 MED ORDER — ONDANSETRON HCL 4 MG PO TABS: 4.0000 mg | ORAL_TABLET | Freq: Four times a day (QID) | ORAL | Status: DC | PRN

## 2015-08-13 MED ORDER — SODIUM CHLORIDE 0.9 % IV SOLN
INTRAVENOUS | Status: AC
  Administered 2015-08-13: 04:00:00 via INTRAVENOUS

## 2015-08-13 MED ORDER — ACETAMINOPHEN 325 MG PO TABS
650.0000 mg | ORAL_TABLET | Freq: Four times a day (QID) | ORAL | Status: DC | PRN
  Administered 2015-08-26: 650 mg via ORAL
  Filled 2015-08-13: qty 2

## 2015-08-13 NOTE — ED Notes (Signed)
CRITICAL VALUE ALERT  Critical value received:  Lactic acid 2.2  Date of notification:  08/13/2015  Time of notification:  01:02  Critical value read back: yes  Nurse who received alert:  Youlanda MightyMelinda Avelina Mcclurkin RN   MD notified (1st page):  Dr Preston FleetingGlick   Time of first page: 01:02  MD notified (2nd page):  Time of second page:  Responding MD:  Dr Preston FleetingGlick   Time MD responded:  01:02

## 2015-08-13 NOTE — Care Management Note (Signed)
Case Management Note  Patient Details  Name: Priscilla Wu MRN: [id number] Date of Birth: 11/15/1925  Subjective/Objective:                  Pt admitted for dehydration secondary to N/V. Pt does not have hearing aids or glasses and is not responding to verbal ques. Pt's daughter in room to provide history. Pt is from home, lives alone, her son lives nearby. Pt is ind with ADL's. Pt drives to church but has family assist with shopping and going to Dr. Algie CofferAppointments. Pt uses a cane for ambulation.   Action/Plan: Anticipate pt will need HH at DC. Will need PT eval when appropriate. Will cont to follow.    Expected Discharge Date:  08/15/15               Expected Discharge Plan:  Home w Home Health Services  In-House Referral:  NA  Discharge planning Services  CM Consult  Post Acute Care Choice:  Home Health Choice offered to:  Patient  DME Arranged:    DME Agency:     HH Arranged:    HH Agency:     Status of Service:  In process, will continue to follow  Medicare Important Message Given:    Date Medicare IM Given:    Medicare IM give by:    Date Additional Medicare IM Given:    Additional Medicare Important Message give by:     If discussed at Long Length of Stay Meetings, dates discussed:    Additional Comments:  Malcolm MetroChildress, Marshall Roehrich Demske, RN 08/13/2015, 3:08 PM

## 2015-08-13 NOTE — H&P (Addendum)
PCP:   Priscilla ReichertMCINNIS,ANGUS G, MD   Chief Complaint:  Vomiting  HPI: 82 year old female who   has a past medical history of Hypertension; UTI (lower urinary tract infection); and Neck pain. Today came to the hospital with complaint of multiple intermittent episodes of nausea and vomiting which started yesterday. The emesis was described as dark colored. Patient denies any abdominal pain or chest pressure shortness of breath. No history of black or bloody stool. She has a history of appendectomy as a teenager.  In the ED x-ray abdomen showed small bowel obstruction him a large hiatal hernia. NG tube with wall suction inserted. Lactate elevated 3.8, repeat 2.2 BUN/creatinine 44/2.32, baseline creatinine 0.96 as of 2015 Allergies:   Allergies  Allergen Reactions  . Penicillins Hives and Rash    Has patient had a PCN reaction causing immediate rash, facial/tongue/throat swelling, SOB or lightheadedness with hypotension: Yes Has patient had a PCN reaction causing severe rash involving mucus membranes or skin necrosis: No Has patient had a PCN reaction that required hospitalization No Has patient had a PCN reaction occurring within the last 10 years: No If all of the above answers are "NO", then may proceed with Cephalosporin use.       Past Medical History  Diagnosis Date  . Hypertension   . UTI (lower urinary tract infection)   . Neck pain     Past Surgical History  Procedure Laterality Date  . Appendectomy      Prior to Admission medications   Medication Sig Start Date End Date Taking? Authorizing Provider  lisinopril-hydrochlorothiazide (PRINZIDE,ZESTORETIC) 20-12.5 MG per tablet Take 1 tablet by mouth daily.   Yes Historical Provider, MD  Multiple Vitamin (MULTIVITAMIN) tablet Take 1 tablet by mouth daily.    Historical Provider, MD  omeprazole (PRILOSEC OTC) 20 MG tablet Take 20 mg by mouth daily.    Historical Provider, MD    Social History:  reports that she has never  smoked. She does not have any smokeless tobacco history on file. She reports that she does not drink alcohol. Her drug history is not on file.  No family history on file.  Filed Weights   06-29-16 2041  Weight: 63.504 kg (140 lb)    All the positives are listed in BOLD  Review of Systems:  HEENT: Headache, blurred vision, runny nose, sore throat Neck: Hypothyroidism, hyperthyroidism,,lymphadenopathy Chest : Shortness of breath, history of COPD, Asthma Heart : Chest pain, history of coronary arterey disease GI:  Nausea, vomiting, diarrhea, constipation, GERD GU: Dysuria, urgency, frequency of urination, hematuria Neuro: Stroke, seizures, syncope Psych: Depression, anxiety, hallucinations   Physical Exam: Blood pressure 93/61, pulse 111, temperature 97.7 F (36.5 C), temperature source Temporal, resp. rate 27, height 5' 0.5" (1.537 m), weight 63.504 kg (140 lb), SpO2 92 %. Constitutional:   Patient is a well-developed and well-nourished female, NG tube in place.. Head: Normocephalic and atraumatic Mouth: Mucus membranes dry Eyes: PERRL, EOMI, conjunctivae normal Neck: Supple, No Thyromegaly Cardiovascular: RRR, S1 normal, S2 normal Pulmonary/Chest: CTAB, no wheezes, rales, or rhonchi Abdominal: Soft. Non-tender, non-distended, bowel sounds are normal, no masses, organomegaly, or guarding present.  Neurological: A&O x3, Strength is normal and symmetric bilaterally, cranial nerve II-XII are grossly intact, no focal motor deficit, sensory intact to light touch bilaterally.  Extremities : No Cyanosis, Clubbing or Edema  Labs on Admission:  Basic Metabolic Panel:  Recent Labs Lab 06-29-16 2111  NA 135  K 3.7  CL 88*  CO2 29  GLUCOSE  236*  BUN 44*  CREATININE 2.32*  CALCIUM 10.3   Liver Function Tests:  Recent Labs Lab 08/16/2015 2111  AST 20  ALT 14  ALKPHOS 76  BILITOT 0.9  PROT 8.1  ALBUMIN 4.2    Recent Labs Lab 08-16-15 2111  LIPASE 82*  CBC:  Recent  Labs Lab August 16, 2015 2111  WBC 21.7*  NEUTROABS 18.9*  HGB 14.8  HCT 44.8  MCV 83.3  PLT 273   Cardiac Enzymes:  Recent Labs Lab 2015/08/16 2111  TROPONINI <0.03     Radiological Exams on Admission: Dg Abd Acute W/chest  08/13/2015  CLINICAL DATA:  Nausea and vomiting.  Chest pain. EXAM: DG ABDOMEN ACUTE W/ 1V CHEST COMPARISON:  Chest radiograph 03/29/2005 FINDINGS: The heart is enlarged. There is a large hiatal hernia. It is difficult to differentiate the hiatal hernia from the heart borders, however both appear increased in size from prior exam. Chronic interstitial changes are again seen. Limited left basilar assessment due to soft tissue attenuation. Diffusely dilated small bowel loops in the central abdomen measure up to 4 cm. Minimal stool in the right colon. No evidence of free intra-abdominal air, however gaseous gastric distension within the hiatal hernia partially limits evaluation for free air. No radiopaque calculi. Bones significantly under mineralized. IMPRESSION: 1. Findings consistent with small-bowel obstruction. 2. Large hiatal hernia, with gaseous distention, likely secondary to bowel obstruction. 3. Cardiomegaly. Cardiac borders difficult to delineate from the adjacent hiatal hernia. Electronically Signed   By: Rubye Oaks M.D.   On: 08/13/2015 00:02    EKG: Independently reviewed. Sinus tachycardia   Assessment/Plan Active Problems:   Hypertension   Nausea and vomiting in adult patient   Small bowel obstruction (HCC)   Small bowel obstruction Admit the patient, continue NG tube suction Will consult general surgery in a.m.  UGI Bleed Gastric occult is positive. Hemoglobin is stable at 14.8 Check h&H q 8 hr, will transfuse as needed for hemoglobin less than 7   Acute kidney injury From vomiting, dehydration Patient's creatinine 2.32, baseline creatinine 0.96 as of November 2015. Continue IV fluids. Follow BMP in a.m.  Leukocytosis WBC 21,000, UA is  negative. Chest x-ray shows no infiltrate Likely reactive. Follow CBC in a.m.  DVT prophylaxis SCDs   Code status: DO NOT RESUSCITATE  Family discussion: Admission, patients condition and plan of care including tests being ordered have been discussed with the patient and her daughter and daughter-in-law at bedside* who indicate understanding and agree with the plan and Code Status.   Time Spent on Admission: 50 min  LAMA,GAGAN S [facility] Pager: [phone] 08/13/2015, 2:25 AM  If 7PM-7AM, please contact night-coverage  [email or web address]  Password [email or web address]

## 2015-08-13 NOTE — Progress Notes (Addendum)
Patient seen and examined. Chart reviewed. Admitted earlier today with abdominal pain and vomiting and found to have SBO on abdominal series. NG was placed. She has blood-tinged emesis (suspect NG trauma during insertion most likely). D/w Dr. Lovell SheehanJenkins who will see patient in consultation. Will trend Hb for next 24 hours. Will d/w daughter later today. Believe she is stable for transfer to floor.  Peggye PittEstela Hernandez, MD [facility] Pager: [phone]

## 2015-08-13 NOTE — Consult Note (Signed)
Reason for Consult: Bowel obstruction Referring Physician: Hospitalist  Priscilla Wu is an 82 y.o. female.  HPI: Patient is an 82 year old white female who was brought by her family to the emergency room with worsening nausea and vomiting. She was found to be hypotensive with renal insufficiency and elevated lactic acid level. Abdominal films revealed a large hiatal hernia as well as small bowel dilatation. No free air was seen. The patient was admitted to the ICU for further management treatment. While she has been in the ICU, she has had approximately 1200 mL of NG tube output. Her blood pressure is somewhat more stable, lactic acid level has decreased. Her leukocytosis is also decreased. She states that she normally has daily bowel movements. She did feel that she had a large hiatal hernia. She denies any abdominal pain at this time.  Past Medical History  Diagnosis Date  . Hypertension   . UTI (lower urinary tract infection)   . Neck pain     Past Surgical History  Procedure Laterality Date  . Appendectomy      No family history on file.  Social History:  reports that she has never smoked. She does not have any smokeless tobacco history on file. She reports that she does not drink alcohol. Her drug history is not on file.  Allergies:  Allergies  Allergen Reactions  . Penicillins Hives and Rash    Has patient had a PCN reaction causing immediate rash, facial/tongue/throat swelling, SOB or lightheadedness with hypotension: Yes Has patient had a PCN reaction causing severe rash involving mucus membranes or skin necrosis: No Has patient had a PCN reaction that required hospitalization No Has patient had a PCN reaction occurring within the last 10 years: No If all of the above answers are "NO", then may proceed with Cephalosporin use.     Medications: I have reviewed the patient's current medications.  Results for orders placed or performed during the hospital encounter of  08/10/2015 (from the past 48 hour(s))  Comprehensive metabolic panel     Status: Abnormal   Collection Time: 08/07/2015  9:11 PM  Result Value Ref Range   Sodium 135 135 - 145 mmol/L   Potassium 3.7 3.5 - 5.1 mmol/L   Chloride 88 (L) 101 - 111 mmol/L   CO2 29 22 - 32 mmol/L   Glucose, Bld 236 (H) 65 - 99 mg/dL   BUN 44 (H) 6 - 20 mg/dL   Creatinine, Ser 2.32 (H) 0.44 - 1.00 mg/dL   Calcium 10.3 8.9 - 10.3 mg/dL   Total Protein 8.1 6.5 - 8.1 g/dL   Albumin 4.2 3.5 - 5.0 g/dL   AST 20 15 - 41 U/L   ALT 14 14 - 54 U/L   Alkaline Phosphatase 76 38 - 126 U/L   Total Bilirubin 0.9 0.3 - 1.2 mg/dL   GFR calc non Af Amer 18 (L) >60 mL/min   GFR calc Af Amer 21 (L) >60 mL/min    Comment: (NOTE) The eGFR has been calculated using the CKD EPI equation. This calculation has not been validated in all clinical situations. eGFR's persistently <60 mL/min signify possible Chronic Kidney Disease.    Anion gap 18 (H) 5 - 15  Lipase, blood     Status: Abnormal   Collection Time: 08/09/2015  9:11 PM  Result Value Ref Range   Lipase 82 (H) 11 - 51 U/L  Troponin I     Status: None   Collection Time: 08/10/2015  9:11 PM  Result Value Ref Range   Troponin I <0.03 <0.031 ng/mL    Comment:        NO INDICATION OF MYOCARDIAL INJURY.   CBC with Differential     Status: Abnormal   Collection Time: 08/05/2015  9:11 PM  Result Value Ref Range   WBC 21.7 (H) 4.0 - 10.5 K/uL   RBC 5.38 (H) 3.87 - 5.11 MIL/uL   Hemoglobin 14.8 12.0 - 15.0 g/dL   HCT 44.8 36.0 - 46.0 %   MCV 83.3 78.0 - 100.0 fL   MCH 27.5 26.0 - 34.0 pg   MCHC 33.0 30.0 - 36.0 g/dL   RDW 14.0 11.5 - 15.5 %   Platelets 273 150 - 400 K/uL   Neutrophils Relative % 87 %   Neutro Abs 18.9 (H) 1.7 - 7.7 K/uL   Lymphocytes Relative 7 %   Lymphs Abs 1.5 0.7 - 4.0 K/uL   Monocytes Relative 6 %   Monocytes Absolute 1.4 (H) 0.1 - 1.0 K/uL   Eosinophils Relative 0 %   Eosinophils Absolute 0.0 0.0 - 0.7 K/uL   Basophils Relative 0 %   Basophils  Absolute 0.0 0.0 - 0.1 K/uL  POC occult blood, ED     Status: None   Collection Time: 08/13/2015  9:54 PM  Result Value Ref Range   Fecal Occult Bld NEGATIVE NEGATIVE  Lactic acid, plasma     Status: Abnormal   Collection Time: 08/10/2015 10:14 PM  Result Value Ref Range   Lactic Acid, Venous 3.8 (HH) 0.5 - 2.0 mmol/L    Comment: CRITICAL RESULT CALLED TO, READ BACK BY AND VERIFIED WITH: WATKINS,P ON 08/05/2015 AT 2305 BY LOY,C   Urinalysis, Routine w reflex microscopic     Status: Abnormal   Collection Time: 08/18/2015 11:28 PM  Result Value Ref Range   Color, Urine AMBER (A) YELLOW    Comment: BIOCHEMICALS MAY BE AFFECTED BY COLOR   APPearance HAZY (A) CLEAR   Specific Gravity, Urine >1.030 (H) 1.005 - 1.030   pH 5.0 5.0 - 8.0   Glucose, UA NEGATIVE NEGATIVE mg/dL   Hgb urine dipstick NEGATIVE NEGATIVE   Bilirubin Urine SMALL (A) NEGATIVE   Ketones, ur TRACE (A) NEGATIVE mg/dL   Protein, ur 30 (A) NEGATIVE mg/dL   Nitrite NEGATIVE NEGATIVE   Leukocytes, UA NEGATIVE NEGATIVE  Urine microscopic-add on     Status: Abnormal   Collection Time: 08/14/2015 11:28 PM  Result Value Ref Range   Squamous Epithelial / LPF 0-5 (A) NONE SEEN   WBC, UA 0-5 0 - 5 WBC/hpf   RBC / HPF 0-5 0 - 5 RBC/hpf   Bacteria, UA MANY (A) NONE SEEN   Casts HYALINE CASTS (A) NEGATIVE    Comment: GRANULAR CAST  Lactic acid, plasma     Status: Abnormal   Collection Time: 08/13/15 12:26 AM  Result Value Ref Range   Lactic Acid, Venous 2.2 (HH) 0.5 - 2.0 mmol/L    Comment: CRITICAL RESULT CALLED TO, READ BACK BY AND VERIFIED WITH: POINDEXTER M AT 0100 ON [id number] BY FORSYTH K   pH, gastric fluid (gastroccult card)     Status: None   Collection Time: 08/13/15  2:20 AM  Result Value Ref Range   pH, Gastric 7   Occult bld gastric/duodenum (cup to lab)     Status: Abnormal   Collection Time: 08/13/15  2:20 AM  Result Value Ref Range   pH, Gastric 7    Occult Blood, Gastric POSITIVE (  A) NEGATIVE  MRSA PCR Screening      Status: None   Collection Time: 08/13/15  3:31 AM  Result Value Ref Range   MRSA by PCR NEGATIVE NEGATIVE    Comment:        The GeneXpert MRSA Assay (FDA approved for NASAL specimens only), is one component of a comprehensive MRSA colonization surveillance program. It is not intended to diagnose MRSA infection nor to guide or monitor treatment for MRSA infections.   CBC     Status: Abnormal   Collection Time: 08/13/15  4:00 AM  Result Value Ref Range   WBC 17.3 (H) 4.0 - 10.5 K/uL   RBC 4.65 3.87 - 5.11 MIL/uL   Hemoglobin 12.3 12.0 - 15.0 g/dL   HCT 38.9 36.0 - 46.0 %   MCV 83.7 78.0 - 100.0 fL   MCH 26.5 26.0 - 34.0 pg   MCHC 31.6 30.0 - 36.0 g/dL   RDW 14.1 11.5 - 15.5 %   Platelets 199 150 - 400 K/uL  Comprehensive metabolic panel     Status: Abnormal   Collection Time: 08/13/15  4:00 AM  Result Value Ref Range   Sodium 139 135 - 145 mmol/L   Potassium 4.1 3.5 - 5.1 mmol/L   Chloride 101 101 - 111 mmol/L   CO2 31 22 - 32 mmol/L   Glucose, Bld 117 (H) 65 - 99 mg/dL   BUN 48 (H) 6 - 20 mg/dL   Creatinine, Ser 1.72 (H) 0.44 - 1.00 mg/dL   Calcium 8.8 (L) 8.9 - 10.3 mg/dL   Total Protein 6.4 (L) 6.5 - 8.1 g/dL   Albumin 3.3 (L) 3.5 - 5.0 g/dL   AST 16 15 - 41 U/L   ALT 11 (L) 14 - 54 U/L   Alkaline Phosphatase 59 38 - 126 U/L   Total Bilirubin 0.5 0.3 - 1.2 mg/dL   GFR calc non Af Amer 26 (L) >60 mL/min   GFR calc Af Amer 30 (L) >60 mL/min    Comment: (NOTE) The eGFR has been calculated using the CKD EPI equation. This calculation has not been validated in all clinical situations. eGFR's persistently <60 mL/min signify possible Chronic Kidney Disease.    Anion gap 7 5 - 15  CBC     Status: Abnormal   Collection Time: 08/13/15 10:39 AM  Result Value Ref Range   WBC 15.8 (H) 4.0 - 10.5 K/uL   RBC 4.48 3.87 - 5.11 MIL/uL   Hemoglobin 12.0 12.0 - 15.0 g/dL   HCT 37.7 36.0 - 46.0 %   MCV 84.2 78.0 - 100.0 fL   MCH 26.8 26.0 - 34.0 pg   MCHC 31.8 30.0 - 36.0  g/dL   RDW 14.2 11.5 - 15.5 %   Platelets 209 150 - 400 K/uL    Dg Abd Acute W/chest  08/13/2015  CLINICAL DATA:  Nausea and vomiting.  Chest pain. EXAM: DG ABDOMEN ACUTE W/ 1V CHEST COMPARISON:  Chest radiograph 03/29/2005 FINDINGS: The heart is enlarged. There is a large hiatal hernia. It is difficult to differentiate the hiatal hernia from the heart borders, however both appear increased in size from prior exam. Chronic interstitial changes are again seen. Limited left basilar assessment due to soft tissue attenuation. Diffusely dilated small bowel loops in the central abdomen measure up to 4 cm. Minimal stool in the right colon. No evidence of free intra-abdominal air, however gaseous gastric distension within the hiatal hernia partially limits evaluation for free air. No  radiopaque calculi. Bones significantly under mineralized. IMPRESSION: 1. Findings consistent with small-bowel obstruction. 2. Large hiatal hernia, with gaseous distention, likely secondary to bowel obstruction. 3. Cardiomegaly. Cardiac borders difficult to delineate from the adjacent hiatal hernia. Electronically Signed   By: Jeb Levering M.D.   On: 08/13/2015 00:02    ROS: See chart Blood pressure 95/64, pulse 108, temperature 97 F (36.1 C), temperature source Axillary, resp. rate 24, height 5' (1.524 m), weight 57.3 kg (126 lb 5.2 oz), SpO2 98 %. Physical Exam: Pleasant white female in no acute distress. Abdomen soft, nontender, nondistended. Minimal bowel sounds appreciated. No rigidity is noted.  Assessment/Plan: Impression: Small bowel obstruction versus ileus. She does not have evidence of torsion of the stomach. She does have a large hiatal hernia. It is reassuring that she has no abdominal pain. Plan: Would continue current management with IV hydration and NG tube decompression. Will defer CT scan decision until tomorrow. Will reexamine in a.m.   Trecia Maring A 08/13/2015, 1:13 PM

## 2015-08-14 LAB — CBC
HEMATOCRIT: 39.5 % (ref 36.0–46.0)
Hemoglobin: 12.5 g/dL (ref 12.0–15.0)
MCH: 27.1 pg (ref 26.0–34.0)
MCHC: 31.6 g/dL (ref 30.0–36.0)
MCV: 85.7 fL (ref 78.0–100.0)
Platelets: 200 10*3/uL (ref 150–400)
RBC: 4.61 MIL/uL (ref 3.87–5.11)
RDW: 14.3 % (ref 11.5–15.5)
WBC: 13 10*3/uL — AB (ref 4.0–10.5)

## 2015-08-14 LAB — URINE CULTURE: Culture: NO GROWTH

## 2015-08-14 MED ORDER — BISACODYL 10 MG RE SUPP
10.0000 mg | Freq: Two times a day (BID) | RECTAL | Status: DC
  Administered 2015-08-14 – 2015-08-16 (×6): 10 mg via RECTAL
  Filled 2015-08-14 (×5): qty 1

## 2015-08-14 NOTE — Progress Notes (Signed)
[facility] PROGRESS NOTE  Priscilla Wu ZOX:[id number] DOB: [birth year] DOA: 08/26/2015 PCP: Alice ReichertMCINNIS,ANGUS G, MD  Assessment/Plan: Small bowel obstruction -Patient denies abdominal pain, had some nausea earlier this morning that resolved with anti-emetics. -She has had significant NG output past 24 hours with about 1750 mL. -Discussed plan with Dr. Lovell SheehanJenkins, will keep NG in today.  Acute renal failure -Likely prerenal, creatinine improving with IV fluids. -Check renal function in a.m.  Leukocytosis -No signs of active infection, WBC decreasing, likely reactive small bowel structure.   Code Status: DO NOT RESUSCITATE Family Communication: Discussed with daughter via phone on 1/13  Disposition Plan: Transfer to floor   Consultants:  Surgery   Antibiotics:  None   Subjective: Currently feels well, no complaints, no abdominal pain, normal bowel movements  Objective: Filed Vitals:   08/14/15 0600 08/14/15 0700 08/14/15 0800 08/14/15 0804  BP: 105/65 107/61 104/65   Pulse: 99 95 96   Temp:    97.9 F (36.6 C)  TempSrc:      Resp: 25 24 23    Height:      Weight:      SpO2: 100% 100% 100%     Intake/Output Summary (Last 24 hours) at 08/14/15 1027 Last data filed at 08/14/15 0900  Gross per 24 hour  Intake    550 ml  Output   2100 ml  Net  -1550 ml   Filed Weights   08/09/2015 2041 08/13/15 0339 08/14/15 0500  Weight: 63.504 kg (140 lb) 57.3 kg (126 lb 5.2 oz) 60.2 kg (132 lb 11.5 oz)    Exam:   General:  Alert, awake, oriented 3  Cardiovascular: Regular rate and rhythm  Respiratory: Clear to auscultation bilaterally  Abdomen: Soft, nontender, nondistended, positive bowel sounds  Extremities: Trace bilateral edema   Neurologic:  Grossly intact and nonfocal, I have not ambulated her.  Data Reviewed: Basic Metabolic Panel:  Recent Labs Lab 08/05/2015 2111 08/13/15 0400  NA 135 139  K 3.7 4.1  CL 88* 101  CO2 29 31  GLUCOSE 236*  117*  BUN 44* 48*  CREATININE 2.32* 1.72*  CALCIUM 10.3 8.8*   Liver Function Tests:  Recent Labs Lab 08/07/2015 2111 08/13/15 0400  AST 20 16  ALT 14 11*  ALKPHOS 76 59  BILITOT 0.9 0.5  PROT 8.1 6.4*  ALBUMIN 4.2 3.3*    Recent Labs Lab 08/28/2015 2111  LIPASE 82*   No results for input(s): AMMONIA in the last 168 hours. CBC:  Recent Labs Lab 08/22/2015 2111 08/13/15 0400 08/13/15 1039 08/13/15 1934 08/14/15 0333  WBC 21.7* 17.3* 15.8* 13.5* 13.0*  NEUTROABS 18.9*  --   --   --   --   HGB 14.8 12.3 12.0 12.0 12.5  HCT 44.8 38.9 37.7 37.8 39.5  MCV 83.3 83.7 84.2 84.9 85.7  PLT 273 199 209 195 200   Cardiac Enzymes:  Recent Labs Lab 08/28/2015 2111  TROPONINI <0.03   BNP (last 3 results) No results for input(s): BNP in the last 8760 hours.  ProBNP (last 3 results) No results for input(s): PROBNP in the last 8760 hours.  CBG: No results for input(s): GLUCAP in the last 168 hours.  Recent Results (from the past 240 hour(s))  MRSA PCR Screening     Status: None   Collection Time: 08/13/15  3:31 AM  Result Value Ref Range Status   MRSA by PCR NEGATIVE NEGATIVE Final    Comment:  The GeneXpert MRSA Assay (FDA approved for NASAL specimens only), is one component of a comprehensive MRSA colonization surveillance program. It is not intended to diagnose MRSA infection nor to guide or monitor treatment for MRSA infections.      Studies: Dg Abd Acute W/chest  08/13/2015  CLINICAL DATA:  Nausea and vomiting.  Chest pain. EXAM: DG ABDOMEN ACUTE W/ 1V CHEST COMPARISON:  Chest radiograph 03/29/2005 FINDINGS: The heart is enlarged. There is a large hiatal hernia. It is difficult to differentiate the hiatal hernia from the heart borders, however both appear increased in size from prior exam. Chronic interstitial changes are again seen. Limited left basilar assessment due to soft tissue attenuation. Diffusely dilated small bowel loops in the central abdomen  measure up to 4 cm. Minimal stool in the right colon. No evidence of free intra-abdominal air, however gaseous gastric distension within the hiatal hernia partially limits evaluation for free air. No radiopaque calculi. Bones significantly under mineralized. IMPRESSION: 1. Findings consistent with small-bowel obstruction. 2. Large hiatal hernia, with gaseous distention, likely secondary to bowel obstruction. 3. Cardiomegaly. Cardiac borders difficult to delineate from the adjacent hiatal hernia. Electronically Signed   By: Rubye Oaks M.D.   On: 08/13/2015 00:02    Scheduled Meds:  Continuous Infusions: . sodium chloride 100 mL/hr at 08/13/15 2300    Active Problems:   Hypertension   Nausea and vomiting in adult patient   Small bowel obstruction (HCC)   AKI (acute kidney injury) (HCC)   SBO (small bowel obstruction) (HCC)    Time spent: 25 minutes. Greater than 50% of this time was spent in direct contact with the patient coordinating care.    Chaya Jan  [facility] Pager [phone]  If 7PM-7AM, please contact night-coverage at [email or web address], password [facility] 08/14/2015, 10:27 AM  LOS: 1 day

## 2015-08-14 NOTE — Care Management Important Message (Signed)
Important Message  Patient Details  Name: Moshe CiproHarriette C Galik MRN: [id number] Date of Birth: 02/25/1926   Medicare Important Message Given:  Yes    Malcolm MetroChildress, Abubakar Crispo Demske, RN 08/14/2015, 12:42 PM

## 2015-08-14 NOTE — Progress Notes (Signed)
Report called to [facility]. Patient transported via wheelchair up to 300.

## 2015-08-14 NOTE — Progress Notes (Signed)
  Subjective: Denies any abdominal pain. Stated she felt nauseated yesterday evening.  Objective: Vital signs in last 24 hours: Temp:  [97.7 F (36.5 C)-98.4 F (36.9 C)] 97.9 F (36.6 C) (01/13 0804) Pulse Rate:  [34-114] 96 (01/13 0800) Resp:  [23-31] 23 (01/13 0800) BP: (100-119)/(61-85) 104/65 mmHg (01/13 0800) SpO2:  [93 %-100 %] 100 % (01/13 0800) Weight:  [60.2 kg (132 lb 11.5 oz)] 60.2 kg (132 lb 11.5 oz) (01/13 0500) Last BM Date: 2015-10-22  Intake/Output from previous day: 01/12 0701 - 01/13 0700 In: 865 [I.V.:865] Out: 2050 [Urine:300; Emesis/NG output:1750] Intake/Output this shift: Total I/O In: -  Out: 150 [Urine:150]  General appearance: alert, cooperative, appears stated age and no distress GI: soft, non-tender; bowel sounds normal; no masses,  no organomegaly  Lab Results:   Recent Labs  08/13/15 1934 08/14/15 0333  WBC 13.5* 13.0*  HGB 12.0 12.5  HCT 37.8 39.5  PLT 195 200   BMET  Recent Labs  2015-10-22 2111 08/13/15 0400  NA 135 139  K 3.7 4.1  CL 88* 101  CO2 29 31  GLUCOSE 236* 117*  BUN 44* 48*  CREATININE 2.32* 1.72*  CALCIUM 10.3 8.8*   PT/INR No results for input(s): LABPROT, INR in the last 72 hours.  Studies/Results: Dg Abd Acute W/chest  08/13/2015  CLINICAL DATA:  Nausea and vomiting.  Chest pain. EXAM: DG ABDOMEN ACUTE W/ 1V CHEST COMPARISON:  Chest radiograph 03/29/2005 FINDINGS: The heart is enlarged. There is Wu large hiatal hernia. It is difficult to differentiate the hiatal hernia from the heart borders, however both appear increased in size from prior exam. Chronic interstitial changes are again seen. Limited left basilar assessment due to soft tissue attenuation. Diffusely dilated small bowel loops in the central abdomen measure up to 4 cm. Minimal stool in the right colon. No evidence of free intra-abdominal air, however gaseous gastric distension within the hiatal hernia partially limits evaluation for free air. No  radiopaque calculi. Bones significantly under mineralized. IMPRESSION: 1. Findings consistent with small-bowel obstruction. 2. Large hiatal hernia, with gaseous distention, likely secondary to bowel obstruction. 3. Cardiomegaly. Cardiac borders difficult to delineate from the adjacent hiatal hernia. Electronically Signed   By: Rubye OaksMelanie  Ehinger M.D.   On: 08/13/2015 00:02    Anti-infectives: Anti-infectives    None      Assessment/Plan: Impression: Bowel obstruction with large hiatal hernia. Her NG tube output is still high. I suspect she has had Wu component of obstructive symptoms due to her large hiatal hernia as well as Wu possible ileus of the bowel. Mechanical obstruction less likely at this time. No need for acute surgical intervention. We'll continue NG tube decompression and reassess in Wu.m.  LOS: 1 day    Priscilla Wu 08/14/2015

## 2015-08-15 ENCOUNTER — Inpatient Hospital Stay (HOSPITAL_COMMUNITY): Payer: Medicare Other

## 2015-08-15 DIAGNOSIS — I4891 Unspecified atrial fibrillation: Secondary | ICD-10-CM

## 2015-08-15 LAB — BASIC METABOLIC PANEL
ANION GAP: 8 (ref 5–15)
BUN: 44 mg/dL — ABNORMAL HIGH (ref 6–20)
CALCIUM: 9 mg/dL (ref 8.9–10.3)
CO2: 36 mmol/L — AB (ref 22–32)
Chloride: 106 mmol/L (ref 101–111)
Creatinine, Ser: 0.66 mg/dL (ref 0.44–1.00)
GFR calc Af Amer: >60 mL/min (ref >60)
GFR calc non Af Amer: >60 mL/min (ref >60)
GLUCOSE: 96 mg/dL (ref 65–99)
Potassium: 3.2 mmol/L — ABNORMAL LOW (ref 3.5–5.1)
Sodium: 150 mmol/L — ABNORMAL HIGH (ref 135–145)

## 2015-08-15 LAB — CBC
HEMATOCRIT: 41.3 % (ref 36.0–46.0)
HEMOGLOBIN: 12.7 g/dL (ref 12.0–15.0)
MCH: 26.8 pg (ref 26.0–34.0)
MCHC: 30.8 g/dL (ref 30.0–36.0)
MCV: 87.1 fL (ref 78.0–100.0)
Platelets: 219 10*3/uL (ref 150–400)
RBC: 4.74 MIL/uL (ref 3.87–5.11)
RDW: 14.4 % (ref 11.5–15.5)
WBC: 12.3 10*3/uL — ABNORMAL HIGH (ref 4.0–10.5)

## 2015-08-15 LAB — MAGNESIUM: MAGNESIUM: 2.4 mg/dL (ref 1.7–2.4)

## 2015-08-15 MED ORDER — POTASSIUM CHLORIDE 10 MEQ/100ML IV SOLN
10.0000 meq | INTRAVENOUS | Status: AC
  Administered 2015-08-15 (×4): 10 meq via INTRAVENOUS

## 2015-08-15 MED ORDER — METOPROLOL TARTRATE 1 MG/ML IV SOLN
5.0000 mg | Freq: Four times a day (QID) | INTRAVENOUS | Status: DC | PRN
  Administered 2015-08-17: 5 mg via INTRAVENOUS
  Filled 2015-08-15: qty 5

## 2015-08-15 MED ORDER — DILTIAZEM HCL 100 MG IV SOLR
5.0000 mg/h | INTRAVENOUS | Status: DC
  Administered 2015-08-15: 5 mg/h via INTRAVENOUS
  Filled 2015-08-15: qty 100

## 2015-08-15 MED ORDER — IOHEXOL 300 MG/ML  SOLN
50.0000 mL | Freq: Once | INTRAMUSCULAR | Status: AC | PRN
Start: 2015-08-15 — End: 2015-08-15
  Administered 2015-08-15: 50 mL via ORAL

## 2015-08-15 MED ORDER — POTASSIUM CHLORIDE 10 MEQ/100ML IV SOLN
10.0000 meq | INTRAVENOUS | Status: DC
  Filled 2015-08-15: qty 100

## 2015-08-15 MED ORDER — IOHEXOL 300 MG/ML  SOLN
100.0000 mL | Freq: Once | INTRAMUSCULAR | Status: AC | PRN
  Administered 2015-08-15: 100 mL via INTRAVENOUS

## 2015-08-15 NOTE — Progress Notes (Signed)
[facility] PROGRESS NOTE  Priscilla Wu MWU:[id number] DOB: 1925-12-03 DOA: 08/21/2015 PCP: Alice ReichertMCINNIS,ANGUS G, MD  Assessment/Plan: Small bowel obstruction -Patient denies abdominal pain, had some nausea earlier this morning that resolved with anti-emetics. -She has had significant NG output past 24 hours with about 1100 mL. -Discussed plan with Dr. Lovell SheehanJenkins, will keep NG in today. -Plan for CT abdomen today. -No flatus or BMs as of yet.  Atrial fibrillation with rapid ventricular response -Was transferred overnight to stepdown unit and placed on Cardizem drip. -This a.m. remains in A. fib however rate is controlled. -Cardizem drip has been discontinued. Given nothing by mouth state and borderline blood pressure, will place on when necessary Lopressor with holding parameters. -We'll obtain 2-D echo to assess for structural abnormalities. -She certainly qualifies for anticoagulation based on CHADSVASC score, however I believe she is a poor candidate given her age and frailty. We'll need to further discuss with daughter.  Acute renal failure -Likely prerenal. -Resolved with IVF.  Leukocytosis -No signs of active infection, WBC decreasing, likely reactive to small bowel obstruction.   Code Status: DO NOT RESUSCITATE Family Communication: Discussed with daughter and granddaughter at bedside, updated on plan of care and all questions answered. Disposition Plan: Keep in stepdown unit today   Consultants:  Surgery   Antibiotics:  None   Subjective: Currently feels well, no complaints, no abdominal pain, normal bowel movements  Objective: Filed Vitals:   08/15/15 0832 08/15/15 0900 08/15/15 1000 08/15/15 1100  BP:  102/61 75/64 100/63  Pulse:  89    Temp: 96.8 F (36 C)     TempSrc: Oral     Resp:  23 25 21   Height:      Weight:      SpO2:  99%      Intake/Output Summary (Last 24 hours) at 08/15/15 1134 Last data filed at 08/15/15 0557  Gross per 24  hour  Intake 2236.03 ml  Output   1400 ml  Net 836.03 ml   Filed Weights   08/13/15 0339 08/14/15 0500 08/15/15 0413  Weight: 57.3 kg (126 lb 5.2 oz) 60.2 kg (132 lb 11.5 oz) 57.2 kg (126 lb 1.7 oz)    Exam:   General:  Alert, awake, oriented 3  Cardiovascular: Regular rate and irregular rhythm  Respiratory: Clear to auscultation bilaterally  Abdomen: Soft, nontender, nondistended, positive bowel sounds  Extremities: Trace bilateral edema   Neurologic:  Grossly intact and nonfocal, I have not ambulated her.  Data Reviewed: Basic Metabolic Panel:  Recent Labs Lab 08/16/2015 2111 08/13/15 0400 08/15/15 0443  NA 135 139 150*  K 3.7 4.1 3.2*  CL 88* 101 106  CO2 29 31 36*  GLUCOSE 236* 117* 96  BUN 44* 48* 44*  CREATININE 2.32* 1.72* 0.66  CALCIUM 10.3 8.8* 9.0   Liver Function Tests:  Recent Labs Lab 08/15/2015 2111 08/13/15 0400  AST 20 16  ALT 14 11*  ALKPHOS 76 59  BILITOT 0.9 0.5  PROT 8.1 6.4*  ALBUMIN 4.2 3.3*    Recent Labs Lab 08/13/2015 2111  LIPASE 82*   No results for input(s): AMMONIA in the last 168 hours. CBC:  Recent Labs Lab 08/04/2015 2111 08/13/15 0400 08/13/15 1039 08/13/15 1934 08/14/15 0333 08/15/15 0443  WBC 21.7* 17.3* 15.8* 13.5* 13.0* 12.3*  NEUTROABS 18.9*  --   --   --   --   --   HGB 14.8 12.3 12.0 12.0 12.5 12.7  HCT 44.8 38.9 37.7 37.8  39.5 41.3  MCV 83.3 83.7 84.2 84.9 85.7 87.1  PLT 273 199 209 195 200 219   Cardiac Enzymes:  Recent Labs Lab 08/26/2015 2111  TROPONINI <0.03   BNP (last 3 results) No results for input(s): BNP in the last 8760 hours.  ProBNP (last 3 results) No results for input(s): PROBNP in the last 8760 hours.  CBG: No results for input(s): GLUCAP in the last 168 hours.  Recent Results (from the past 240 hour(s))  Urine culture     Status: None   Collection Time: 08/10/2015 11:28 PM  Result Value Ref Range Status   Specimen Description URINE, CATHETERIZED  Final   Special Requests  NONE  Final   Culture   Final    NO GROWTH 1 DAY Performed at [facility]    Report Status 08/14/2015 FINAL  Final  MRSA PCR Screening     Status: None   Collection Time: 08/13/15  3:31 AM  Result Value Ref Range Status   MRSA by PCR NEGATIVE NEGATIVE Final    Comment:        The GeneXpert MRSA Assay (FDA approved for NASAL specimens only), is one component of a comprehensive MRSA colonization surveillance program. It is not intended to diagnose MRSA infection nor to guide or monitor treatment for MRSA infections.      Studies: No results found.  Scheduled Meds: . bisacodyl  10 mg Rectal BID AC  . potassium chloride  10 mEq Intravenous Q1 Hr x 4   Continuous Infusions: . sodium chloride 100 mL/hr at 08/15/15 0415    Active Problems:   Hypertension   Nausea and vomiting in adult patient   Small bowel obstruction (HCC)   AKI (acute kidney injury) (HCC)   SBO (small bowel obstruction) (HCC)   Atrial fibrillation with RVR (HCC)    Time spent: 25 minutes. Greater than 50% of this time was spent in direct contact with the patient coordinating care.    Chaya Jan  [facility] Pager [phone]  If 7PM-7AM, please contact night-coverage at [email or web address], password [facility] 08/15/2015, 11:34 AM  LOS: 2 days

## 2015-08-15 NOTE — Progress Notes (Signed)
Dr Sharl MaLama notified pt is now is SR & is on 5mg  cardizem

## 2015-08-15 NOTE — Progress Notes (Signed)
Patients HR is going up as high as 150's, paged on call MD to make him aware, will follow any new orders received and continue to monitor.

## 2015-08-15 NOTE — Progress Notes (Signed)
  Subjective: Denies any abdominal pain. No flatus yet.  Objective: Vital signs in last 24 hours: Temp:  [96.8 F (36 C)-98.2 F (36.8 C)] 96.8 F (36 C) (01/14 0832) Pulse Rate:  [93-114] 93 (01/14 0557) Resp:  [22-25] 23 (01/14 0557) BP: (89-118)/(56-72) 89/58 mmHg (01/14 0500) SpO2:  [99 %-100 %] 100 % (01/14 0557) Weight:  [57.2 kg (126 lb 1.7 oz)] 57.2 kg (126 lb 1.7 oz) (01/14 0413) Last BM Date: 08/20/2015  Intake/Output from previous day: 01/13 0701 - 01/14 0700 In: 2236 [I.V.:2236] Out: 1550 [Urine:450; Emesis/NG output:1100] Intake/Output this shift:    General appearance: alert, cooperative and no distress GI: soft, non-tender; bowel sounds normal; no masses,  no organomegaly  Lab Results:   Recent Labs  08/14/15 0333 08/15/15 0443  WBC 13.0* 12.3*  HGB 12.5 12.7  HCT 39.5 41.3  PLT 200 219   BMET  Recent Labs  08/13/15 0400 08/15/15 0443  NA 139 150*  K 4.1 3.2*  CL 101 106  CO2 31 36*  GLUCOSE 117* 96  BUN 48* 44*  CREATININE 1.72* 0.66  CALCIUM 8.8* 9.0   PT/INR No results for input(s): LABPROT, INR in the last 72 hours.  Studies/Results: No results found.  Anti-infectives: Anti-infectives    None      Assessment/Plan: Impression: Small bowel obstruction, still with high NG tube output. Plan: We'll get CT scan of abdomen with oral contrast today. Further management pending those results.  LOS: 2 days    Biagio Snelson A 08/15/2015

## 2015-08-15 NOTE — Progress Notes (Signed)
Report called to ICU RN, Priscilla Wu, Patient was transferred to ICU step down via bed on 2 liters of oxygen via nasal canula. Patients was in NAD related to her condition and her daugther was notified of her transfer.

## 2015-08-15 NOTE — Progress Notes (Signed)
Called by RN the patient's heart rate went to 130s to 140s. EKG obtained which shows A. fib with RVR. We will start Cardizem infusion 5 mg per hour, titrate to heart rate less than 100. We'll transfer the patient to stepdown unit.

## 2015-08-16 ENCOUNTER — Inpatient Hospital Stay (HOSPITAL_COMMUNITY): Payer: Medicare Other

## 2015-08-16 DIAGNOSIS — I4891 Unspecified atrial fibrillation: Secondary | ICD-10-CM

## 2015-08-16 LAB — BASIC METABOLIC PANEL
Anion gap: 10 (ref 5–15)
BUN: 38 mg/dL — ABNORMAL HIGH (ref 6–20)
CALCIUM: 8.6 mg/dL — AB (ref 8.9–10.3)
CHLORIDE: 105 mmol/L (ref 101–111)
CO2: 34 mmol/L — AB (ref 22–32)
CREATININE: 0.78 mg/dL (ref 0.44–1.00)
GFR calc Af Amer: >60 mL/min (ref >60)
GFR calc non Af Amer: >60 mL/min (ref >60)
GLUCOSE: 70 mg/dL (ref 65–99)
Potassium: 3.3 mmol/L — ABNORMAL LOW (ref 3.5–5.1)
Sodium: 149 mmol/L — ABNORMAL HIGH (ref 135–145)

## 2015-08-16 MED ORDER — SODIUM CHLORIDE 0.9 % IV SOLN: Freq: Once | INTRAVENOUS | Status: AC

## 2015-08-16 MED ORDER — KCL IN DEXTROSE-NACL 20-5-0.45 MEQ/L-%-% IV SOLN
INTRAVENOUS | Status: DC
  Administered 2015-08-16 – 2015-08-17 (×2): via INTRAVENOUS

## 2015-08-16 MED ORDER — POTASSIUM CHLORIDE 10 MEQ/100ML IV SOLN
10.0000 meq | INTRAVENOUS | Status: DC
Start: 2015-08-16 — End: 2015-08-16

## 2015-08-16 MED ORDER — POTASSIUM CHLORIDE 10 MEQ/100ML IV SOLN
10.0000 meq | INTRAVENOUS | Status: AC
  Administered 2015-08-16 (×4): 10 meq via INTRAVENOUS
  Filled 2015-08-16 (×2): qty 100

## 2015-08-16 NOTE — Progress Notes (Signed)
[facility] PROGRESS NOTE  Priscilla Wu ZOX:[id number] DOB: 02/23/1926 DOA: 08/02/2015 PCP: Alice Reichert, MD  Assessment/Plan: Small bowel obstruction. -She has had significant NG output past 24 hours with about 1100 mL. SBO not resolved as per CT scan. -Discussed plan with Dr. Lovell Sheehan, OR in am for ex lap and lysis of adhesions. -No flatus or BMs as of yet.  Atrial fibrillation with rapid ventricular response -Has converted to NSR. - Given nothing by mouth state and borderline blood pressure, will place on when necessary Lopressor with holding parameters. -We'll obtain 2-D echo to assess for structural abnormalities. -She certainly qualifies for anticoagulation based on CHADSVASC score, however I believe she is a poor candidate given her age and frailty. We'll need to further discuss with daughter. Will not start now as she is to have surgery for SBO.  Acute renal failure -Likely prerenal. -Resolved with IVF.  Leukocytosis -No signs of active infection, WBC decreasing, likely reactive to small bowel obstruction.  Questionable Acute Pancreatitis -Seen on CT scan. -Patient without abdominal pain.   Code Status: DO NOT RESUSCITATE Family Communication: Discussed with daughter and granddaughter at bedside, updated on plan of care and all questions answered. Disposition Plan: Keep in stepdown unit today   Consultants:  Surgery   Antibiotics:  None   Subjective: Currently feels well, no complaints, no abdominal pain, normal bowel movements. Still with significant NG output.  Objective: Filed Vitals:   08/16/15 0700 08/16/15 0800 08/16/15 0818 08/16/15 0900  BP: 106/56 95/53  102/61  Pulse:  35    Temp:   97.5 F (36.4 C)   TempSrc:   Oral   Resp: 19 23  22   Height:      Weight:      SpO2:  100%      Intake/Output Summary (Last 24 hours) at 08/16/15 1116 Last data filed at 08/16/15 0600  Gross per 24 hour  Intake   3260 ml  Output   2925 ml   Net    335 ml   Filed Weights   08/14/15 0500 08/15/15 0413 08/16/15 0500  Weight: 60.2 kg (132 lb 11.5 oz) 57.2 kg (126 lb 1.7 oz) 58.5 kg (128 lb 15.5 oz)    Exam:   General:  Alert, awake, oriented 3  Cardiovascular: Regular rate and irregular rhythm  Respiratory: Clear to auscultation bilaterally  Abdomen: Soft, nontender, nondistended, positive bowel sounds  Extremities: Trace bilateral edema   Neurologic:  Grossly intact and nonfocal, I have not ambulated her.  Data Reviewed: Basic Metabolic Panel:  Recent Labs Lab 08/11/2015 2111 08/13/15 0400 08/15/15 0443 08/16/15 0457  NA 135 139 150* 149*  K 3.7 4.1 3.2* 3.3*  CL 88* 101 106 105  CO2 29 31 36* 34*  GLUCOSE 236* 117* 96 70  BUN 44* 48* 44* 38*  CREATININE 2.32* 1.72* 0.66 0.78  CALCIUM 10.3 8.8* 9.0 8.6*  MG  --   --  2.4  --    Liver Function Tests:  Recent Labs Lab 09/01/2015 2111 08/13/15 0400  AST 20 16  ALT 14 11*  ALKPHOS 76 59  BILITOT 0.9 0.5  PROT 8.1 6.4*  ALBUMIN 4.2 3.3*    Recent Labs Lab 08/15/2015 2111  LIPASE 82*   No results for input(s): AMMONIA in the last 168 hours. CBC:  Recent Labs Lab 08/28/2015 2111 08/13/15 0400 08/13/15 1039 08/13/15 1934 08/14/15 0333 08/15/15 0443  WBC 21.7* 17.3* 15.8* 13.5* 13.0* 12.3*  NEUTROABS 18.9*  --   --   --   --   --  HGB 14.8 12.3 12.0 12.0 12.5 12.7  HCT 44.8 38.9 37.7 37.8 39.5 41.3  MCV 83.3 83.7 84.2 84.9 85.7 87.1  PLT 273 199 209 195 200 219   Cardiac Enzymes:  Recent Labs Lab 08-23-2015 2111  TROPONINI <0.03   BNP (last 3 results) No results for input(s): BNP in the last 8760 hours.  ProBNP (last 3 results) No results for input(s): PROBNP in the last 8760 hours.  CBG: No results for input(s): GLUCAP in the last 168 hours.  Recent Results (from the past 240 hour(s))  Urine culture     Status: None   Collection Time: 08-23-2015 11:28 PM  Result Value Ref Range Status   Specimen Description URINE,  CATHETERIZED  Final   Special Requests NONE  Final   Culture   Final    NO GROWTH 1 DAY [facility]formed [facility]    Report Status 08/14/2015 FINAL  Final  MRSA PCR Screening     Status: None   Collection Time: 08/13/15  3:31 AM  Result Value Ref Range Status   MRSA by PCR NEGATIVE NEGATIVE Final    Comment:        The GeneXpert MRSA Assay (FDA approved for NASAL specimens only), is one component of a comprehensive MRSA colonization surveillance program. It is not intended to diagnose MRSA infection nor to guide or monitor treatment for MRSA infections.      Studies: Ct Abdomen Pelvis W Contrast  08/15/2015  CLINICAL DATA:  Small bowel obstruction. History of hypertension and appendectomy. EXAM: CT ABDOMEN AND PELVIS WITH CONTRAST TECHNIQUE: Multidetector CT imaging of the abdomen and pelvis was performed using the standard protocol following bolus administration of intravenous contrast. CONTRAST:  50mL OMNIPAQUE IOHEXOL 300 MG/ML SOLN, 100mL OMNIPAQUE IOHEXOL 300 MG/ML SOLN COMPARISON:  Abdominal radiographs, Nov 22, 2015. FINDINGS: Lung bases: Large hiatal hernia. This accounts for the apparent enlargement of the cardiopericardial silhouette. The heart is normal in size. Small pleural effusions. Interstitial thickening is noted at the lung bases. There is dependent lung opacity likely atelectasis. Hepatobiliary: Normal liver. Multiple gallstones in a mild to moderately distended gallbladder. No evidence acute cholecystitis. No bile duct dilation or convincing duct stone. Pancreas: Heterogeneous inflammatory changes with ill-defined fluid density cystic lesions are noted surrounding the pancreas. One of the better defined cystic lesions lies along the pancreatic tail measuring 2 cm in size. Another lies along the superior margin of the pancreatic body and proximal tail measuring 2.2 cm. The pancreatic parenchyma enhances diffusely. There is dilation of the pancreatic duct in the  pancreatic tail to 4 mm. Spleen: Small low-density lesion along the medial margin of the spleen consistent with cyst, measuring 8 mm in size. Spleen otherwise unremarkable. Adrenal glands, kidneys, ureters, bladder: No adrenal masses. Kidneys show bilateral renal sinus cysts. No hydronephrosis. Ureters normal in course and in caliber. Bladder is unremarkable. Uterus and adnexa:  Unremarkable. Lymph nodes:  No discrete enlarged lymph nodes. Ascites: Trace fluid is seen along the antral lateral margin of the liver. Vascular: Portal vein, superior mesenteric vein and splenic vein are widely patent. Atherosclerotic calcifications are noted along abdominal aorta. Gastrointestinal: The majority of the stomach extends through the esophageal hiatus to form the large hiatal hernia. There is dilation of the small bowel to a maximum of 3.9 cm, with small bowel air-fluid levels. There is decompressed distal small bowel and colon is mostly decompressed. Under the transition point between dilated and decompressed small bowel is not distended, it is suggested in  the left pelvis. There are diverticula along with a left colon without diverticulitis. There is no bowel wall thickening or mesenteric inflammation. Musculoskeletal: Severe compression fracture of T12, moderate compression fracture of L1 and mild compression fracture of L4. Bones diffusely demineralized. These fractures are all presumed old. No osteoblastic or osteolytic lesions. IMPRESSION: 1. There is evidence of 2 acute abnormalities. First, there are inflammatory changes surrounding pancreas with at least to discrete cystic lesions. Findings are consistent with acute pancreatitis with early formation of pseudocysts. There is no evidence of pancreatic necrosis. No venous thrombosis. There are gallstones. Cause of the pancreatitis may be gallstone pancreatitis. No evidence of acute cholecystitis. 2. The other apparent acute abnormality is a partial small bowel  obstruction. The transition point is suggested in the left pelvis. There is no bowel wall thickening to suggest inflammation or ischemia. 3. Left colon diverticulosis without evidence of diverticulitis. 4. No cardiomegaly. There is a large hiatal hernia projects posterior to the heart. There small pleural effusions and dependent lung base atelectasis. Electronically Signed   By: Amie Portland M.D.   On: 08/15/2015 15:02    Scheduled Meds: . sodium chloride   Intravenous Once  . bisacodyl  10 mg Rectal BID AC  . potassium chloride  10 mEq Intravenous Q1 Hr x 3   Continuous Infusions: . dextrose 5 % and 0.45 % NaCl with KCl 20 mEq/L      Active Problems:   Hypertension   Nausea and vomiting in adult patient   Small bowel obstruction (HCC)   AKI (acute kidney injury) (HCC)   SBO (small bowel obstruction) (HCC)   Atrial fibrillation with RVR (HCC)    Time spent: 25 minutes. Greater than 50% of this time was spent in direct contact with the patient coordinating care.    Chaya Jan  [facility] Pager [phone]  If 7PM-7AM, please contact night-coverage at [email or web address], password [facility] 08/16/2015, 11:16 AM  LOS: 3 days

## 2015-08-16 NOTE — Progress Notes (Signed)
Patient having significant NG tube output. CT scan reviewed. Despite aggressive medical therapy, patient's bowel obstruction has not resolved. This is complicated by her large hiatal hernia. Discussed extensively with patient and family. Will proceed with exploratory laparotomy and probable lysis of adhesions tomorrow. The risks and benefits of the procedure including bleeding, infection, cardiopulmonary difficulties, and the possibility of bowel resection were fully explained to the patient and family, who gave informed consent. I did tell them that her large hiatal hernia will be a complicating factor in her recovery. All questions were answered.

## 2015-08-17 ENCOUNTER — Encounter (HOSPITAL_COMMUNITY): Payer: Self-pay | Admitting: *Deleted

## 2015-08-17 ENCOUNTER — Encounter (HOSPITAL_COMMUNITY): Admission: EM | Disposition: E | Payer: Self-pay | Source: Home / Self Care | Attending: Family Medicine

## 2015-08-17 ENCOUNTER — Inpatient Hospital Stay (HOSPITAL_COMMUNITY): Payer: Medicare Other | Admitting: Anesthesiology

## 2015-08-17 ENCOUNTER — Inpatient Hospital Stay (HOSPITAL_COMMUNITY): Payer: Medicare Other

## 2015-08-17 HISTORY — PX: LAPAROTOMY: SHX154

## 2015-08-17 HISTORY — PX: BOWEL RESECTION: SHX1257

## 2015-08-17 LAB — BASIC METABOLIC PANEL
Anion gap: 6 (ref 5–15)
BUN: 32 mg/dL — AB (ref 6–20)
CHLORIDE: 110 mmol/L (ref 101–111)
CO2: 34 mmol/L — AB (ref 22–32)
CREATININE: 0.62 mg/dL (ref 0.44–1.00)
Calcium: 8.2 mg/dL — ABNORMAL LOW (ref 8.9–10.3)
GFR calc Af Amer: >60 mL/min (ref >60)
GFR calc non Af Amer: >60 mL/min (ref >60)
Glucose, Bld: 168 mg/dL — ABNORMAL HIGH (ref 65–99)
POTASSIUM: 3.9 mmol/L (ref 3.5–5.1)
Sodium: 150 mmol/L — ABNORMAL HIGH (ref 135–145)

## 2015-08-17 LAB — CBC
HEMATOCRIT: 35 % — AB (ref 36.0–46.0)
HEMOGLOBIN: 10.9 g/dL — AB (ref 12.0–15.0)
MCH: 27.5 pg (ref 26.0–34.0)
MCHC: 31.1 g/dL (ref 30.0–36.0)
MCV: 88.4 fL (ref 78.0–100.0)
Platelets: 185 10*3/uL (ref 150–400)
RBC: 3.96 MIL/uL (ref 3.87–5.11)
RDW: 14.3 % (ref 11.5–15.5)
WBC: 12 10*3/uL — ABNORMAL HIGH (ref 4.0–10.5)

## 2015-08-17 LAB — TYPE AND SCREEN
ABO/RH(D): A POS
Antibody Screen: NEGATIVE

## 2015-08-17 SURGERY — LAPAROTOMY, EXPLORATORY
Anesthesia: General

## 2015-08-17 MED ORDER — LACTATED RINGERS IV SOLN
INTRAVENOUS | Status: DC
  Administered 2015-08-18 – 2015-08-19 (×5): via INTRAVENOUS
  Administered 2015-08-19: 1000 mL via INTRAVENOUS
  Administered 2015-08-20: 02:00:00 via INTRAVENOUS
  Administered 2015-08-20: 1000 mL via INTRAVENOUS
  Administered 2015-08-21: 17:00:00 via INTRAVENOUS

## 2015-08-17 MED ORDER — MIDAZOLAM HCL 2 MG/2ML IJ SOLN
INTRAMUSCULAR | Status: AC
  Filled 2015-08-17: qty 2

## 2015-08-17 MED ORDER — MORPHINE SULFATE (PF) 2 MG/ML IV SOLN
2.0000 mg | INTRAVENOUS | Status: DC | PRN
  Administered 2015-08-17: 1 mg via INTRAVENOUS
  Administered 2015-08-18 – 2015-08-22 (×5): 2 mg via INTRAVENOUS
  Filled 2015-08-17 (×6): qty 1

## 2015-08-17 MED ORDER — FENTANYL CITRATE (PF) 100 MCG/2ML IJ SOLN
INTRAMUSCULAR | Status: DC | PRN
  Administered 2015-08-17 (×4): 25 ug via INTRAVENOUS

## 2015-08-17 MED ORDER — CIPROFLOXACIN IN D5W 400 MG/200ML IV SOLN
INTRAVENOUS | Status: DC | PRN
  Administered 2015-08-17: 400 mg via INTRAVENOUS

## 2015-08-17 MED ORDER — MIDAZOLAM HCL 2 MG/2ML IJ SOLN
1.0000 mg | INTRAMUSCULAR | Status: DC | PRN
  Administered 2015-08-17: 2 mg via INTRAVENOUS

## 2015-08-17 MED ORDER — POVIDONE-IODINE 10 % EX OINT
TOPICAL_OINTMENT | CUTANEOUS | Status: DC | PRN
  Administered 2015-08-17: 1 via TOPICAL

## 2015-08-17 MED ORDER — EPHEDRINE SULFATE 50 MG/ML IJ SOLN
INTRAMUSCULAR | Status: AC
  Filled 2015-08-17: qty 1

## 2015-08-17 MED ORDER — ONDANSETRON HCL 4 MG/2ML IJ SOLN
INTRAMUSCULAR | Status: AC
  Filled 2015-08-17: qty 2

## 2015-08-17 MED ORDER — SUCCINYLCHOLINE CHLORIDE 20 MG/ML IJ SOLN
INTRAMUSCULAR | Status: DC | PRN
  Administered 2015-08-17: 100 mg via INTRAVENOUS

## 2015-08-17 MED ORDER — SODIUM CHLORIDE 0.9 % IV BOLUS (SEPSIS)
1000.0000 mL | Freq: Once | INTRAVENOUS | Status: AC
  Administered 2015-08-17: 1000 mL via INTRAVENOUS

## 2015-08-17 MED ORDER — POVIDONE-IODINE 10 % EX OINT
TOPICAL_OINTMENT | CUTANEOUS | Status: AC
  Filled 2015-08-17: qty 1

## 2015-08-17 MED ORDER — ETOMIDATE 2 MG/ML IV SOLN
INTRAVENOUS | Status: DC | PRN
  Administered 2015-08-17: 10 mg via INTRAVENOUS

## 2015-08-17 MED ORDER — ROCURONIUM BROMIDE 50 MG/5ML IV SOLN
INTRAVENOUS | Status: AC
  Filled 2015-08-17: qty 2

## 2015-08-17 MED ORDER — ACETAMINOPHEN 325 MG PO TABS: 650.0000 mg | ORAL_TABLET | Freq: Four times a day (QID) | ORAL | Status: DC | PRN

## 2015-08-17 MED ORDER — SODIUM CHLORIDE 0.9 % IJ SOLN
INTRAMUSCULAR | Status: AC
  Filled 2015-08-17: qty 10

## 2015-08-17 MED ORDER — ACETAMINOPHEN 650 MG RE SUPP: 650.0000 mg | Freq: Four times a day (QID) | RECTAL | Status: DC | PRN

## 2015-08-17 MED ORDER — CIPROFLOXACIN IN D5W 400 MG/200ML IV SOLN
INTRAVENOUS | Status: AC
  Filled 2015-08-17: qty 200

## 2015-08-17 MED ORDER — SODIUM CHLORIDE 0.9 % IR SOLN
Status: DC | PRN
  Administered 2015-08-17: 2000 mL

## 2015-08-17 MED ORDER — ONDANSETRON HCL 4 MG/2ML IJ SOLN
4.0000 mg | Freq: Once | INTRAMUSCULAR | Status: AC
  Administered 2015-08-17: 4 mg via INTRAVENOUS

## 2015-08-17 MED ORDER — ROCURONIUM BROMIDE 50 MG/5ML IV SOLN
INTRAVENOUS | Status: AC
  Filled 2015-08-17: qty 4

## 2015-08-17 MED ORDER — FENTANYL CITRATE (PF) 100 MCG/2ML IJ SOLN
INTRAMUSCULAR | Status: AC
  Filled 2015-08-17: qty 2

## 2015-08-17 MED ORDER — ROCURONIUM BROMIDE 100 MG/10ML IV SOLN
INTRAVENOUS | Status: DC | PRN
Start: 2015-08-17 — End: 2015-08-17
  Administered 2015-08-17: 5 mg via INTRAVENOUS

## 2015-08-17 MED ORDER — SUCCINYLCHOLINE CHLORIDE 20 MG/ML IJ SOLN
INTRAMUSCULAR | Status: AC
  Filled 2015-08-17: qty 2

## 2015-08-17 MED ORDER — ARTIFICIAL TEARS OP OINT
TOPICAL_OINTMENT | OPHTHALMIC | Status: AC
  Filled 2015-08-17: qty 3.5

## 2015-08-17 MED ORDER — SODIUM CHLORIDE 0.9 % IV BOLUS (SEPSIS)
500.0000 mL | Freq: Once | INTRAVENOUS | Status: AC
  Administered 2015-08-17: 500 mL via INTRAVENOUS

## 2015-08-17 MED ORDER — PHENYLEPHRINE HCL 10 MG/ML IJ SOLN
INTRAMUSCULAR | Status: DC | PRN
  Administered 2015-08-17 (×6): 80 ug via INTRAVENOUS

## 2015-08-17 MED ORDER — LACTATED RINGERS IV SOLN
Freq: Once | INTRAVENOUS | Status: AC
  Administered 2015-08-17: 15:00:00 via INTRAVENOUS

## 2015-08-17 MED ORDER — LIDOCAINE HCL (PF) 1 % IJ SOLN
INTRAMUSCULAR | Status: AC
  Filled 2015-08-17: qty 20

## 2015-08-17 MED ORDER — SODIUM CHLORIDE 0.9 % IJ SOLN
INTRAMUSCULAR | Status: AC
  Filled 2015-08-17: qty 3

## 2015-08-17 MED ORDER — ENOXAPARIN SODIUM 30 MG/0.3ML ~~LOC~~ SOLN
30.0000 mg | SUBCUTANEOUS | Status: DC
Start: 2015-08-18 — End: 2015-08-19
  Administered 2015-08-18 – 2015-08-19 (×2): 30 mg via SUBCUTANEOUS
  Filled 2015-08-17 (×2): qty 0.3

## 2015-08-17 MED ORDER — LACTATED RINGERS IV SOLN
INTRAVENOUS | Status: DC
  Administered 2015-08-17 (×3): via INTRAVENOUS

## 2015-08-17 MED ORDER — SODIUM CHLORIDE 0.9 % IV BOLUS (SEPSIS)
500.0000 mL | Freq: Once | INTRAVENOUS | Status: AC
  Administered 2015-08-18: 500 mL via INTRAVENOUS

## 2015-08-17 MED ORDER — CHLORHEXIDINE GLUCONATE 4 % EX LIQD: 1.0000 "application " | Freq: Once | CUTANEOUS | Status: DC

## 2015-08-17 MED ORDER — NEOSTIGMINE METHYLSULFATE 10 MG/10ML IV SOLN
INTRAVENOUS | Status: AC
  Filled 2015-08-17: qty 1

## 2015-08-17 MED ORDER — EPHEDRINE SULFATE 50 MG/ML IJ SOLN
INTRAMUSCULAR | Status: DC | PRN
  Administered 2015-08-17: 5 mg via INTRAVENOUS

## 2015-08-17 MED ORDER — LIDOCAINE HCL (CARDIAC) 20 MG/ML IV SOLN
INTRAVENOUS | Status: DC | PRN
  Administered 2015-08-17: 20 mg via INTRAVENOUS

## 2015-08-17 MED ORDER — PHENYLEPHRINE 40 MCG/ML (10ML) SYRINGE FOR IV PUSH (FOR BLOOD PRESSURE SUPPORT)
PREFILLED_SYRINGE | INTRAVENOUS | Status: AC
  Filled 2015-08-17: qty 10

## 2015-08-17 MED ORDER — SUCCINYLCHOLINE CHLORIDE 20 MG/ML IJ SOLN
INTRAMUSCULAR | Status: AC
  Filled 2015-08-17: qty 1

## 2015-08-17 MED ORDER — ALBUTEROL SULFATE HFA 108 (90 BASE) MCG/ACT IN AERS
INHALATION_SPRAY | RESPIRATORY_TRACT | Status: AC
  Filled 2015-08-17: qty 13.4

## 2015-08-17 MED ORDER — ETOMIDATE 2 MG/ML IV SOLN
INTRAVENOUS | Status: AC
  Filled 2015-08-17: qty 10

## 2015-08-17 SURGICAL SUPPLY — 61 items
APPLIER CLIP 11 MED OPEN (CLIP)
APPLIER CLIP 13 LRG OPEN (CLIP)
APR CLP LRG 13 20 CLIP (CLIP)
APR CLP MED 11 20 MLT OPN (CLIP)
BAG HAMPER (MISCELLANEOUS) ×3 IMPLANT
BARRIER SKIN 2 3/4 (OSTOMY) IMPLANT
BARRIER SKIN 2 3/4 INCH (OSTOMY)
BARRIER SKIN OD2.25 2 3/4 FLNG (OSTOMY) IMPLANT
BRR SKN FLT 2.75X2.25 2 PC (OSTOMY)
CHLORAPREP W/TINT 26ML (MISCELLANEOUS) ×3 IMPLANT
CLAMP POUCH DRAINAGE QUIET (OSTOMY) IMPLANT
CLIP APPLIE 11 MED OPEN (CLIP) IMPLANT
CLIP APPLIE 13 LRG OPEN (CLIP) IMPLANT
CLOTH BEACON ORANGE TIMEOUT ST (SAFETY) ×3 IMPLANT
COVER LIGHT HANDLE STERIS (MISCELLANEOUS) ×6 IMPLANT
DRAPE WARM FLUID 44X44 (DRAPE) ×3 IMPLANT
DRSG OPSITE POSTOP 4X10 (GAUZE/BANDAGES/DRESSINGS) ×3 IMPLANT
DRSG OPSITE POSTOP 4X8 (GAUZE/BANDAGES/DRESSINGS) ×2 IMPLANT
ELECT BLADE 6 FLAT ULTRCLN (ELECTRODE) IMPLANT
ELECT REM PT RETURN 9FT ADLT (ELECTROSURGICAL) ×3
ELECTRODE REM PT RTRN 9FT ADLT (ELECTROSURGICAL) ×1 IMPLANT
GAUZE SPONGE 4X4 12PLY STRL (GAUZE/BANDAGES/DRESSINGS) ×3 IMPLANT
GLOVE BIOGEL PI IND STRL 7.0 (GLOVE) ×1 IMPLANT
GLOVE BIOGEL PI INDICATOR 7.0 (GLOVE) ×2
GLOVE SURG SS PI 7.5 STRL IVOR (GLOVE) ×9 IMPLANT
GOWN STRL REUS W/TWL LRG LVL3 (GOWN DISPOSABLE) ×9 IMPLANT
INST SET MAJOR GENERAL (KITS) ×3 IMPLANT
KIT REMOVER STAPLE SKIN (MISCELLANEOUS) IMPLANT
KIT ROOM TURNOVER APOR (KITS) ×3 IMPLANT
LIGASURE IMPACT 36 18CM CVD LR (INSTRUMENTS) ×2 IMPLANT
MANIFOLD NEPTUNE II (INSTRUMENTS) ×3 IMPLANT
NS IRRIG 1000ML POUR BTL (IV SOLUTION) ×3 IMPLANT
PACK ABDOMINAL MAJOR (CUSTOM PROCEDURE TRAY) ×3 IMPLANT
PAD ARMBOARD 7.5X6 YLW CONV (MISCELLANEOUS) ×3 IMPLANT
POUCH OSTOMY 2 3/4  H 3804 (WOUND CARE)
POUCH OSTOMY 2 3/4 H 3804 (WOUND CARE)
POUCH OSTOMY 2 PC DRNBL 2.75 (WOUND CARE) IMPLANT
RELOAD LINEAR CUT PROX 55 BLUE (ENDOMECHANICALS) ×6 IMPLANT
RELOAD PROXIMATE 75MM BLUE (ENDOMECHANICALS) IMPLANT
RELOAD STAPLE 55 3.8 BLU REG (ENDOMECHANICALS) IMPLANT
RELOAD STAPLE 75 3.8 BLU REG (ENDOMECHANICALS) IMPLANT
RETRACTOR WND ALEXIS 25 LRG (MISCELLANEOUS) IMPLANT
RTRCTR WOUND ALEXIS 25CM LRG (MISCELLANEOUS)
SET BASIN LINEN APH (SET/KITS/TRAYS/PACK) ×3 IMPLANT
SPONGE LAP 18X18 X RAY DECT (DISPOSABLE) ×3 IMPLANT
STAPLER GUN LINEAR PROX 60 (STAPLE) ×2 IMPLANT
STAPLER PROXIMATE 55 BLUE (STAPLE) ×2 IMPLANT
STAPLER PROXIMATE 75MM BLUE (STAPLE) IMPLANT
STAPLER VISISTAT (STAPLE) ×3 IMPLANT
SUCTION POOLE TIP (SUCTIONS) ×3 IMPLANT
SUT CHROMIC 0 SH (SUTURE) IMPLANT
SUT CHROMIC 2 0 SH (SUTURE) IMPLANT
SUT CHROMIC 3 0 SH 27 (SUTURE) ×3 IMPLANT
SUT NOVA NAB GS-26 0 60 (SUTURE) ×6 IMPLANT
SUT PDS AB 0 CTX 60 (SUTURE) ×2 IMPLANT
SUT PROLENE 2 0 SH 30 (SUTURE) ×3 IMPLANT
SUT SILK 2 0 (SUTURE) ×3
SUT SILK 2 0 REEL (SUTURE) ×3 IMPLANT
SUT SILK 2-0 18XBRD TIE 12 (SUTURE) ×1 IMPLANT
SUT SILK 3 0 SH CR/8 (SUTURE) IMPLANT
TRAY FOLEY CATH SILVER 16FR (SET/KITS/TRAYS/PACK) ×3 IMPLANT

## 2015-08-17 NOTE — Progress Notes (Signed)
Patient continuing to result low blood pressures (systollicaly 60s and 70s). Midlevel provider alerted via text page. 1 liter bolus of NS ordered. Patient remains coherent and reports no complaints.

## 2015-08-17 NOTE — OR Nursing (Signed)
Wears bilateral  Hearing aids ,   Dentures   Are   At home.   Unable to remove ring to left ring finger.

## 2015-08-17 NOTE — Anesthesia Preprocedure Evaluation (Signed)
Anesthesia Evaluation  Patient identified by MRN, date of birth, ID band Patient awake    Reviewed: Allergy & Precautions, NPO status , Patient's Chart, lab work & pertinent test results  Airway Mallampati: II  TM Distance: >3 FB   Mouth opening: Limited Mouth Opening  Dental  (+) Edentulous Upper, Edentulous Lower   Pulmonary neg pulmonary ROS,    breath sounds clear to auscultation       Cardiovascular hypertension, Pt. on medications  Rhythm:Regular Rate:Normal     Neuro/Psych    GI/Hepatic SBO    Endo/Other    Renal/GU      Musculoskeletal   Abdominal   Peds  Hematology   Anesthesia Other Findings   Reproductive/Obstetrics                             Anesthesia Physical Anesthesia Plan  ASA: III  Anesthesia Plan: General   Post-op Pain Management:    Induction: Intravenous, Rapid sequence and Cricoid pressure planned  Airway Management Planned: Oral ETT  Additional Equipment:   Intra-op Plan:   Post-operative Plan: Extubation in OR  Informed Consent: I have reviewed the patients History and Physical, chart, labs and discussed the procedure including the risks, benefits and alternatives for the proposed anesthesia with the patient or authorized representative who has indicated his/her understanding and acceptance.     Plan Discussed with:   Anesthesia Plan Comments:         Anesthesia Quick Evaluation

## 2015-08-17 NOTE — Anesthesia Procedure Notes (Signed)
Procedure Name: Intubation Date/Time: 08/28/2015 1:04 PM Performed by: Pernell DupreADAMS, AMY A Pre-anesthesia Checklist: Patient identified, Patient being monitored, Timeout performed, Emergency Drugs available and Suction available Patient Re-evaluated:Patient Re-evaluated prior to inductionOxygen Delivery Method: Circle System Utilized Preoxygenation: Pre-oxygenation with 100% oxygen Intubation Type: IV induction, Rapid sequence and Cricoid Pressure applied Ventilation: Mask ventilation without difficulty Laryngoscope Size: 3 and Miller Grade View: Grade III Tube type: Oral Tube size: 7.0 mm Number of attempts: 1 Airway Equipment and Method: Stylet Placement Confirmation: ETT inserted through vocal cords under direct vision,  positive ETCO2 and breath sounds checked- equal and bilateral Secured at: 21 cm Tube secured with: Tape Dental Injury: Teeth and Oropharynx as per pre-operative assessment

## 2015-08-17 NOTE — Op Note (Signed)
Patient:  Priscilla Wu  DOB:  1926/01/26  MRN:  [id number]   Preop Diagnosis:  Small bowel obstruction  Postop Diagnosis:  Same  Procedure:  Exploratory laparotomy, partial small bowel resection  Surgeon:  Franky MachoMark Anzley Dibbern, M.D.  Anes:  Gen. endotracheal  Indications:  Patient is an 82 year old white female who presents with a partial small bowel obstruction. This did not resolve with NG tube decompression, thus she has come in to the operating room for exploratory laparotomy. The risks and benefits of the procedure including bleeding, infection, cardiopulmonary difficulties, and the possibility of a bowel resection were fully explained to the patient, who gave informed consent.  Procedure note:  The patient was placed the supine position. After induction of general endotracheal anesthesia, the abdomen was prepped and draped using the usual sterile technique with DuraPrep. Surgical site confirmation was performed.  A midline incision was made from above the umbilicus to just below the umbilicus. The peritoneal cavity was entered into without difficulty. The small bowel was then inspected from the ligament of Treitz to the terminal ileum. The patient had multiple large mouth jejunal diverticula present. There was a twist of bowel with a significant narrowing in the distal small bowel due to an adhesion in the left pelvis. This was freed away but a small enterotomy was present due to ischemia. Approximately 6 inches of small bowel was thus isolated. A GIA 55 stapler was placed proximally and distally around this area. The mesentery was divided using LigaSure. The specimen was then removed from the operative field and sent to pathology. A side to side enteroenterostomy was performed using a GIA 55 stapler. The enterotomy was closed using a TA 60 stapler. The staple line was bolstered using 3-0 silk sutures. The mesenteric defect was closed using 3-0 silk interrupted sutures. The bowel was returned  into the abdominal cavity an orderly fashion once the remaining small bowel to the terminal ileum was inspected. The colon and liver were all within normal limits. The gallbladder was palpated and noted to have multiple stones present. No evidence of cholecystitis was noted. The patient's stomach was primarily in the chest cavity through a large defect in the posterior portion of the diaphragm. All operating personnel then changed their). The abdominal cavity was copiously irrigated with normal saline. All fluid was evacuated from the abdominal cavity. The fascia was reapproximated using a looped 0 Novafil running suture. Subcutaneous layer was irrigated with normal saline and the skin was closed using staples. Betadine ointment and dry sterile dressings were applied.  All tape and needle counts were correct at the end of the procedure. Patient was extubated in the operating room and transferred to PACU in stable condition.  Complications:  None  EBL:  Minimal  Specimen:  Small bowel

## 2015-08-17 NOTE — Anesthesia Postprocedure Evaluation (Signed)
Anesthesia Post Note  Patient: Priscilla Wu  Procedure(s) Performed: Procedure(s) (LRB): EXPLORATORY LAPAROTOMY  (N/A) PARTIAL SMALL BOWEL RESECTION (N/A)  Patient location during evaluation: PACU Anesthesia Type: General Level of consciousness: oriented and awake Pain management: pain level controlled Vital Signs Assessment: post-procedure vital signs reviewed and stable Respiratory status: spontaneous breathing Cardiovascular status: blood pressure returned to baseline Postop Assessment: no signs of nausea or vomiting Anesthetic complications: no    Last Vitals:  Filed Vitals:   08/18/2015 1415 08/15/2015 1430  BP: 75/47 92/55  Pulse:  108  Temp:    Resp: 31 39    Last Pain:  Filed Vitals:   08/02/2015 1442  PainSc: 0-No pain                 ADAMS, AMY A

## 2015-08-17 NOTE — Transfer of Care (Signed)
Immediate Anesthesia Transfer of Care Note  Patient: Priscilla Wu  Procedure(s) Performed: Procedure(s): EXPLORATORY LAPAROTOMY PARTIAL SMALL BOWEL RESECTION (N/A)  Patient Location: PACU  Anesthesia Type:General  Level of Consciousness: awake, alert , oriented and patient cooperative  Airway & Oxygen Therapy: Patient Spontanous Breathing and Patient connected to face mask oxygen  Post-op Assessment: Report given to RN and Post -op Vital signs reviewed and stable  Post vital signs: Reviewed and stable  Last Vitals:  Filed Vitals:   08/13/2015 1200 08/15/2015 1206  BP: 110/66 111/65  Pulse:  101  Temp:  36.3 C  Resp: 42 31    Complications: No apparent anesthesia complications

## 2015-08-17 NOTE — Progress Notes (Signed)
RT unable to obtain ABG, will attempt again later.

## 2015-08-17 NOTE — Progress Notes (Signed)
After return from the OR she was noted to be in atrial fibrillation with rapid ventricular response with rates in the 120s to 130s. Respiratory rate has also been elevated in the 40s. Upon my initial assessment she said her abdomen was hurting so she was given 1 mg of morphine. Her blood pressure has subsequently been low in the 60s systolic. She is currently receiving her second 500 mL saline bolus. Concerned that she might get fluid overloaded. Chest x-ray and ABG are pending. Patient is currently a full code. Discussed situation via phone with daughter Lawson FiscalLori who will confer with her brother and we will update CODE STATUS later today. We'll continue to monitor closely.  Peggye PittEstela Hernandez, MD [facility] Pager: [phone]

## 2015-08-17 NOTE — Progress Notes (Signed)
Took pt off NRB and placed on 6L HFNC. Patient's 02 saturations at 100%. RT will continue to monitor and wean as tolerated.

## 2015-08-17 NOTE — Progress Notes (Signed)
[facility] PROGRESS NOTE  Priscilla Wu UJW:[id number] DOB: 10/22/1925 DOA: 08/22/2015 PCP: Alice ReichertMCINNIS,ANGUS G, MD  Assessment/Plan: Small bowel obstruction. -As she has failed to improved with conservative measures, plan is for surgery today. -Appreciate Dr. Lovell SheehanJenkins input and recommendations.  Atrial fibrillation with rapid ventricular response -Has converted to NSR. -ECHO: Study Conclusions  - Procedure narrative: Transthoracic echocardiography. Image quality was suboptimal. The study was technically difficult, as a result of poor acoustic windows and restricted patient mobility. - Left ventricle: The cavity size was normal. Wall thickness was increased in a pattern of mild LVH. There is inferoseptal hypokinesis to akinesis, consistent with prior infarct. Systolic function was mildly reduced. The estimated ejection fraction was in the range of 45% to 50%. The study is not technically sufficient to allow evaluation of LV diastolic function.  -She certainly qualifies for anticoagulation based on CHADSVASC score, however I believe she is a poor candidate given her age and frailty. We'll need to further discuss with daughter. Will not start now as she is to have surgery for SBO.  Acute renal failure -Likely prerenal. -Resolved with IVF.  Leukocytosis -No signs of active infection, WBC decreasing, likely reactive to small bowel obstruction.  Questionable Acute Pancreatitis -Seen on CT scan. -Patient without abdominal pain.   Code Status: DO NOT RESUSCITATE Family Communication: Multiple family members at bedside updated on plan of care. Disposition Plan: Keep in stepdown unit today   Consultants:  Surgery   Antibiotics:  None   Subjective: Currently feels well, no complaints, no abdominal pain, normal bowel movements. Still with significant NG output. Says she is "ready" for surgery.  Objective: Filed Vitals:   03-12-16 0600 03-12-16  0700 03-12-16 0800 03-12-16 0900  BP: 107/65 108/65 103/61 105/62  Pulse:      Temp:   97.3 F (36.3 C)   TempSrc:      Resp: 32 29 28 30   Height:      Weight:      SpO2:        Intake/Output Summary (Last 24 hours) at 03-12-16 1003 Last data filed at 03-12-16 0500  Gross per 24 hour  Intake   2820 ml  Output   1975 ml  Net    845 ml   Filed Weights   08/15/15 0413 08/16/15 0500 03-12-16 0500  Weight: 57.2 kg (126 lb 1.7 oz) 58.5 kg (128 lb 15.5 oz) 59.5 kg (131 lb 2.8 oz)    Exam:   General:  Alert, awake, oriented 3  Cardiovascular: Regular rate and irregular rhythm  Respiratory: Clear to auscultation bilaterally  Abdomen: Soft, nontender, nondistended, positive bowel sounds  Extremities: Trace bilateral edema   Neurologic:  Grossly intact and nonfocal, I have not ambulated her.  Data Reviewed: Basic Metabolic Panel:  Recent Labs Lab 09/01/2015 2111 08/13/15 0400 08/15/15 0443 08/16/15 0457 03-12-16 0500  NA 135 139 150* 149* 150*  K 3.7 4.1 3.2* 3.3* 3.9  CL 88* 101 106 105 110  CO2 29 31 36* 34* 34*  GLUCOSE 236* 117* 96 70 168*  BUN 44* 48* 44* 38* 32*  CREATININE 2.32* 1.72* 0.66 0.78 0.62  CALCIUM 10.3 8.8* 9.0 8.6* 8.2*  MG  --   --  2.4  --   --    Liver Function Tests:  Recent Labs Lab 08/03/2015 2111 08/13/15 0400  AST 20 16  ALT 14 11*  ALKPHOS 76 59  BILITOT 0.9 0.5  PROT 8.1 6.4*  ALBUMIN 4.2 3.3*  Recent Labs Lab 09/04/2015 2111  LIPASE 82*   No results for input(s): AMMONIA in the last 168 hours. CBC:  Recent Labs Lab 2015/09/04 2111  08/13/15 1039 08/13/15 1934 08/14/15 0333 08/15/15 0443 08/19/2015 0500  WBC 21.7*  < > 15.8* 13.5* 13.0* 12.3* 12.0*  NEUTROABS 18.9*  --   --   --   --   --   --   HGB 14.8  < > 12.0 12.0 12.5 12.7 10.9*  HCT 44.8  < > 37.7 37.8 39.5 41.3 35.0*  MCV 83.3  < > 84.2 84.9 85.7 87.1 88.4  PLT 273  < > 209 195 200 219 185  < > = values in this interval not displayed. Cardiac  Enzymes:  Recent Labs Lab 04-Sep-2015 2111  TROPONINI <0.03   BNP (last 3 results) No results for input(s): BNP in the last 8760 hours.  ProBNP (last 3 results) No results for input(s): PROBNP in the last 8760 hours.  CBG: No results for input(s): GLUCAP in the last 168 hours.  Recent Results (from the past 240 hour(s))  Urine culture     Status: None   Collection Time: 09-04-15 11:28 PM  Result Value Ref Range Status   Specimen Description URINE, CATHETERIZED  Final   Special Requests NONE  Final   Culture   Final    NO GROWTH 1 DAY Performed at [facility]    Report Status 08/14/2015 FINAL  Final  MRSA PCR Screening     Status: None   Collection Time: 08/13/15  3:31 AM  Result Value Ref Range Status   MRSA by PCR NEGATIVE NEGATIVE Final    Comment:        The GeneXpert MRSA Assay (FDA approved for NASAL specimens only), is one component of a comprehensive MRSA colonization surveillance program. It is not intended to diagnose MRSA infection nor to guide or monitor treatment for MRSA infections.      Studies: Ct Abdomen Pelvis W Contrast  08/15/2015  CLINICAL DATA:  Small bowel obstruction. History of hypertension and appendectomy. EXAM: CT ABDOMEN AND PELVIS WITH CONTRAST TECHNIQUE: Multidetector CT imaging of the abdomen and pelvis was performed using the standard protocol following bolus administration of intravenous contrast. CONTRAST:  50mL OMNIPAQUE IOHEXOL 300 MG/ML SOLN, OMNIPAQUE IOHEXOL 300 MG/ML SOLN COMPARISON:  Abdominal radiographs, 09-04-15. FINDINGS: Lung bases: Large hiatal hernia. This accounts for the apparent enlargement of the cardiopericardial silhouette. The heart is normal in size. Small pleural effusions. Interstitial thickening is noted at the lung bases. There is dependent lung opacity likely atelectasis. Hepatobiliary: Normal liver. Multiple gallstones in a mild to moderately distended gallbladder. No evidence acute cholecystitis.  No bile duct dilation or convincing duct stone. Pancreas: Heterogeneous inflammatory changes with ill-defined fluid density cystic lesions are noted surrounding the pancreas. One of the better defined cystic lesions lies along the pancreatic tail measuring 2 cm in size. Another lies along the superior margin of the pancreatic body and proximal tail measuring 2.2 cm. The pancreatic parenchyma enhances diffusely. There is dilation of the pancreatic duct in the pancreatic tail to 4 mm. Spleen: Small low-density lesion along the medial margin of the spleen consistent with cyst, measuring 8 mm in size. Spleen otherwise unremarkable. Adrenal glands, kidneys, ureters, bladder: No adrenal masses. Kidneys show bilateral renal sinus cysts. No hydronephrosis. Ureters normal in course and in caliber. Bladder is unremarkable. Uterus and adnexa:  Unremarkable. Lymph nodes:  No discrete enlarged lymph nodes. Ascites: Trace fluid is  seen along the antral lateral margin of the liver. Vascular: Portal vein, superior mesenteric vein and splenic vein are widely patent. Atherosclerotic calcifications are noted along abdominal aorta. Gastrointestinal: The majority of the stomach extends through the esophageal hiatus to form the large hiatal hernia. There is dilation of the small bowel to a maximum of 3.9 cm, with small bowel air-fluid levels. There is decompressed distal small bowel and colon is mostly decompressed. Under the transition point between dilated and decompressed small bowel is not distended, it is suggested in the left pelvis. There are diverticula along with a left colon without diverticulitis. There is no bowel wall thickening or mesenteric inflammation. Musculoskeletal: Severe compression fracture of T12, moderate compression fracture of L1 and mild compression fracture of L4. Bones diffusely demineralized. These fractures are all presumed old. No osteoblastic or osteolytic lesions. IMPRESSION: 1. There is evidence of 2  acute abnormalities. First, there are inflammatory changes surrounding pancreas with at least to discrete cystic lesions. Findings are consistent with acute pancreatitis with early formation of pseudocysts. There is no evidence of pancreatic necrosis. No venous thrombosis. There are gallstones. Cause of the pancreatitis may be gallstone pancreatitis. No evidence of acute cholecystitis. 2. The other apparent acute abnormality is a partial small bowel obstruction. The transition point is suggested in the left pelvis. There is no bowel wall thickening to suggest inflammation or ischemia. 3. Left colon diverticulosis without evidence of diverticulitis. 4. No cardiomegaly. There is a large hiatal hernia projects posterior to the heart. There small pleural effusions and dependent lung base atelectasis. Electronically Signed   By: Amie Portland M.D.   On: 08/15/2015 15:02    Scheduled Meds: . bisacodyl  10 mg Rectal BID AC   Continuous Infusions: . dextrose 5 % and 0.45 % NaCl with KCl 20 mEq/L 100 mL/hr at 09/05/15 0500    Active Problems:   Hypertension   Nausea and vomiting in adult patient   Small bowel obstruction (HCC)   AKI (acute kidney injury) (HCC)   SBO (small bowel obstruction) (HCC)   Atrial fibrillation with RVR (HCC)    Time spent: 25 minutes. Greater than 50% of this time was spent in direct contact with the patient coordinating care.    Chaya Jan  [facility] Pager [phone]  If 7PM-7AM, please contact night-coverage at [email or web address], password [facility] 09-05-2015, 10:03 AM  LOS: 4 days

## 2015-08-17 NOTE — Progress Notes (Signed)
Patient returned from PACU with RN. Complaints of slight shortness of breath. HR 120's. Rhythm a-fib. Unable to get an accurate oxygen saturation. RR 40's. Dr. Ardyth HarpsHernandez notified.

## 2015-08-18 ENCOUNTER — Inpatient Hospital Stay (HOSPITAL_COMMUNITY): Payer: Medicare Other

## 2015-08-18 DIAGNOSIS — I959 Hypotension, unspecified: Secondary | ICD-10-CM

## 2015-08-18 LAB — BASIC METABOLIC PANEL
Anion gap: 9 (ref 5–15)
BUN: 32 mg/dL — AB (ref 6–20)
CALCIUM: 7.5 mg/dL — AB (ref 8.9–10.3)
CO2: 24 mmol/L (ref 22–32)
Chloride: 117 mmol/L — ABNORMAL HIGH (ref 101–111)
Creatinine, Ser: 1.27 mg/dL — ABNORMAL HIGH (ref 0.44–1.00)
GFR calc Af Amer: 43 mL/min — ABNORMAL LOW (ref >60)
GFR, EST NON AFRICAN AMERICAN: 37 mL/min — AB (ref >60)
GLUCOSE: 76 mg/dL (ref 65–99)
Potassium: 4.2 mmol/L (ref 3.5–5.1)
Sodium: 150 mmol/L — ABNORMAL HIGH (ref 135–145)

## 2015-08-18 LAB — CBC
HEMATOCRIT: 38.5 % (ref 36.0–46.0)
Hemoglobin: 11.9 g/dL — ABNORMAL LOW (ref 12.0–15.0)
MCH: 27.6 pg (ref 26.0–34.0)
MCHC: 30.9 g/dL (ref 30.0–36.0)
MCV: 89.3 fL (ref 78.0–100.0)
PLATELETS: 176 10*3/uL (ref 150–400)
RBC: 4.31 MIL/uL (ref 3.87–5.11)
RDW: 14.7 % (ref 11.5–15.5)
WBC: 23.2 10*3/uL — AB (ref 4.0–10.5)

## 2015-08-18 LAB — MAGNESIUM: Magnesium: 1.5 mg/dL — ABNORMAL LOW (ref 1.7–2.4)

## 2015-08-18 LAB — PHOSPHORUS: Phosphorus: 2.1 mg/dL — ABNORMAL LOW (ref 2.5–4.6)

## 2015-08-18 MED ORDER — IPRATROPIUM BROMIDE 0.02 % IN SOLN
0.5000 mg | RESPIRATORY_TRACT | Status: DC | PRN
Start: 2015-08-18 — End: 2015-08-30

## 2015-08-18 MED ORDER — PHENYLEPHRINE HCL 10 MG/ML IJ SOLN
INTRAMUSCULAR | Status: AC
  Filled 2015-08-18: qty 4

## 2015-08-18 MED ORDER — PHENYLEPHRINE HCL 10 MG/ML IJ SOLN
0.0000 ug/min | INTRAVENOUS | Status: DC
  Administered 2015-08-18: 130 ug/min via INTRAVENOUS
  Administered 2015-08-18 – 2015-08-20 (×10): 160 ug/min via INTRAVENOUS
  Administered 2015-08-21: 170 ug/min via INTRAVENOUS
  Administered 2015-08-21 (×3): 150 ug/min via INTRAVENOUS
  Administered 2015-08-21: 60 ug/min via INTRAVENOUS
  Administered 2015-08-22: 10 ug/min via INTRAVENOUS
  Administered 2015-08-22: 35 ug/min via INTRAVENOUS
  Administered 2015-08-22: 60 ug/min via INTRAVENOUS
  Filled 2015-08-18 (×15): qty 4

## 2015-08-18 MED ORDER — PHENYLEPHRINE HCL 10 MG/ML IJ SOLN
INTRAMUSCULAR | Status: AC
  Filled 2015-08-18: qty 2

## 2015-08-18 MED ORDER — PHENYLEPHRINE HCL 10 MG/ML IJ SOLN
0.0000 ug/min | INTRAVENOUS | Status: DC
  Administered 2015-08-18: 20 ug/min via INTRAVENOUS
  Administered 2015-08-18: 100 ug/min via INTRAVENOUS
  Filled 2015-08-18 (×2): qty 2

## 2015-08-18 MED ORDER — NOREPINEPHRINE BITARTRATE 1 MG/ML IV SOLN
INTRAVENOUS | Status: AC
  Filled 2015-08-18: qty 16

## 2015-08-18 MED ORDER — NOREPINEPHRINE BITARTRATE 1 MG/ML IV SOLN
0.0000 ug/min | INTRAVENOUS | Status: DC
  Administered 2015-08-18: 4 ug/min via INTRAVENOUS
  Filled 2015-08-18 (×2): qty 16

## 2015-08-18 MED ORDER — MAGNESIUM SULFATE 2 GM/50ML IV SOLN
2.0000 g | Freq: Once | INTRAVENOUS | Status: AC
  Administered 2015-08-18: 2 g via INTRAVENOUS
  Filled 2015-08-18: qty 50

## 2015-08-18 NOTE — Progress Notes (Signed)
1 Day Post-Op  Subjective: Alert and oriented. States she is not short of breath. Denies any significant abdominal pain.  Objective: Vital signs in last 24 hours: Temp:  [97.1 F (36.2 C)-98 F (36.7 C)] 98 F (36.7 C) (01/17 0400) Pulse Rate:  [41-111] 110 (01/17 0800) Resp:  [13-46] 31 (01/17 0800) BP: (46-117)/(21-66) 76/57 mmHg (01/17 0800) SpO2:  [81 %-100 %] 100 % (01/17 0800) Weight:  [59.421 kg (131 lb)-65.8 kg (145 lb 1 oz)] 65.8 kg (145 lb 1 oz) (01/17 0500) Last BM Date: 08/18/2015  Intake/Output from previous day: 01/16 0701 - 01/17 0700 In: 5152.7 [I.V.:4236; IV Piggyback:916.7] Out: 250 [Urine:250] Intake/Output this shift: Total I/O In: -  Out: 150 [Urine:150]  General appearance: cooperative, appears stated age and no distress GI: soft, incision healing well.  Lab Results:   Recent Labs  08/08/2015 0500 08/18/15 0458  WBC 12.0* 23.2*  HGB 10.9* 11.9*  HCT 35.0* 38.5  PLT 185 176   BMET  Recent Labs  08/22/2015 0500 08/18/15 0458  NA 150* 150*  K 3.9 4.2  CL 110 117*  CO2 34* 24  GLUCOSE 168* 76  BUN 32* 32*  CREATININE 0.62 1.27*  CALCIUM 8.2* 7.5*   PT/INR No results for input(s): LABPROT, INR in the last 72 hours.  Studies/Results: Dg Chest Port 1 View  08/18/2015  CLINICAL DATA:  Hypotension.  Shortness of breath. EXAM: PORTABLE CHEST 1 VIEW COMPARISON:  Chest radiograph 08/26/2015. FINDINGS: Enteric tube tip projects over the left upper quadrant, side-port projects within the midchest. Left upper extremity PICC line tip projects over the superior cavoatrial junction. Patient has known large hiatal hernia. Stable enlarged cardiac and mediastinal contours. Low lung volumes. Interval worsening diffuse bilateral interstitial pulmonary opacities. Small bilateral pleural effusions. No definite pneumothorax. IMPRESSION: Enteric tube tip projects in left upper quadrant, side-port projects in the chest. Recommend clinical correlation for desired  positioning given patient's known large hiatal hernia. Interval worsening diffuse bilateral interstitial pulmonary opacities suggestive of worsening pulmonary edema. Low lung volumes.  Small bilateral pleural effusions. These results will be called to the ordering clinician or representative by the Radiologist Assistant, and communication documented in the PACS or zVision Dashboard. Electronically Signed   By: Annia Belt M.D.   On: 08/18/2015 08:35   Dg Chest Port 1 View  08/08/2015  CLINICAL DATA:  Shortness of breath.  Small bowel obstruction. EXAM: PORTABLE CHEST 1 VIEW COMPARISON:  2015/08/18 FINDINGS: There has been placement of enteric catheter, tip at the expected location of gastric cardia and side hole at the level of the distal esophagus. Cardiomediastinal silhouette is normal. Mediastinal contours appear intact. There is a large hiatal hernia. There is no evidence of pneumothorax. There is compressive atelectasis of bilateral lung bases. Small pleural effusions are noted. Osseous structures are without acute abnormality. Soft tissues are grossly normal. IMPRESSION: Enteric catheter with tip overlying the proximal stomach, in the settings of large hiatal hernia. Bilateral small pleural effusions and bibasilar lower lobe compressive atelectasis. Electronically Signed   By: Ted Mcalpine M.D.   On: 08/11/2015 18:57    Anti-infectives: Anti-infectives    None      Assessment/Plan: s/p Procedure(s): EXPLORATORY LAPAROTOMY  PARTIAL SMALL BOWEL RESECTION impression: Patient continues to be hypotensive requiring multiple IV fluid boluses and pressor support. She is in atrial fibrillation. Renal function worsening. She probably is hypovolemic but is unable to mount an adequate ejection fraction due to the atrial fibrillation. Discussed extensively with Dr. Ardyth Harps, who  may try to convert her back to normal sinus rhythm. Chest x-ray has been ordered. We will follow expectantly with you.   LOS: 5 days    Aristidis Talerico A 08/18/2015

## 2015-08-18 NOTE — Anesthesia Postprocedure Evaluation (Signed)
Anesthesia Post Note  Patient: Priscilla Wu  Procedure(s) Performed: Procedure(s) (LRB): EXPLORATORY LAPAROTOMY  (N/A) PARTIAL SMALL BOWEL RESECTION (N/A)  Patient location during evaluation: ICU Anesthesia Type: General Level of consciousness: awake and alert Pain management: pain level controlled Vital Signs Assessment: vitals unstable Respiratory status: spontaneous breathing and patient connected to nasal cannula oxygen (Patient on high flow [place] with sats 100%; CXR shows small pleural effusions; patient tachypneic; noted in chart ABG attempted but unable to obtain) Cardiovascular status: unstable (Afib, HR 120's-130's; requiring pressors for BP support) Anesthetic complications: no (Patient remains hemodynamically unstable but no apparent anesthetic complications.)    Last Vitals:  Filed Vitals:   08/18/15 0715 08/18/15 0730  BP: 79/55 75/52  Pulse: 107 111  Temp:    Resp: 24 36    Last Pain:  Filed Vitals:   08/18/15 0739  PainSc: Asleep                 Wandy Bossler A

## 2015-08-18 NOTE — Progress Notes (Signed)
Patients heart rate has increased since starting levophed with no real change to blood pressure. Weaning off. MD order for Neosynephrine. Starting neo drip at 20.

## 2015-08-18 NOTE — Progress Notes (Signed)
[facility] PROGRESS NOTE  Priscilla Wu GNF:[id number] DOB: [birth year] DOA: 08/28/2015 PCP: Alice Reichert, MD  Assessment/Plan: Small bowel obstruction. -S/p ex lap on 1/16. -Minimal abdominal pain.  Hypotension -Received 3.5L fluid boluses overnight, without response. -No further fluid resuscitation given pulmonary edema on CXR. -Is now requiring pressor support. -Maintain neosynephrine for now. BP currently 70/40s. -Still not completely out of the woods in regards to potential intubation.  Atrial fibrillation with rapid ventricular response -Has converted to NSR. -ECHO: Study Conclusions  - Procedure narrative: Transthoracic echocardiography. Image quality was suboptimal. The study was technically difficult, as a result of poor acoustic windows and restricted patient mobility. - Left ventricle: The cavity size was normal. Wall thickness was increased in a pattern of mild LVH. There is inferoseptal hypokinesis to akinesis, consistent with prior infarct. Systolic function was mildly reduced. The estimated ejection fraction was in the range of 45% to 50%. The study is not technically sufficient to allow evaluation of LV diastolic function.  -She certainly qualifies for anticoagulation based on CHADSVASC score, however I believe she is a poor candidate given her age and frailty. We'll need to further discuss with daughter. Will not start now as she is immediately post-op for her SBO. -No rate-controlling meds given her pressor-dependant hypotension.  Acute renal failure -Likely prerenal. -Resolved with IVF.  Leukocytosis -Significantly worsened today likely in response to surgery on 1/16. -Continue to monitor. -See no indication for antibiotics at present.  Questionable Acute Pancreatitis -Seen on CT scan. -Patient without abdominal pain.   Code Status: DO NOT RESUSCITATE Family Communication: Multiple family members at bedside updated on  plan of care. Disposition Plan: Keep in ICU today. PT when able.   Consultants:  Surgery   Antibiotics:  None   Subjective: No complaints. Appears frail. Significant hypotension ON despite fluid boluses requiring initiation of pressors. Objective: Filed Vitals:   08/18/15 0845 08/18/15 0900 08/18/15 0915 08/18/15 0930  BP: 82/50  Pulse:      Temp:      TempSrc:      Resp:      Height:      Weight:      SpO2:        Intake/Output Summary (Last 24 hours) at 08/18/15 1009 Last data filed at 08/18/15 0807  Gross per 24 hour  Intake 5152.66 ml  Output    400 ml  Net 4752.66 ml   Filed Weights   31-Aug-2015 0500 2015-08-31 1235 08/18/15 0500  Weight: 59.5 kg (131 lb 2.8 oz) 59.421 kg (131 lb) 65.8 kg (145 lb 1 oz)    Exam:   General:  Alert, awake, oriented 3  Cardiovascular: Regular rate and irregular rhythm  Respiratory: Bilateral crackles  Abdomen: Soft, nontender, nondistended, positive bowel sounds  Extremities: Trace bilateral edema   Neurologic:  moves all 4 spontaneously  Data Reviewed: Basic Metabolic Panel:  Recent Labs Lab 08/13/15 0400 08/15/15 0443 08/16/15 0457 08-31-15 0500 08/18/15 0458  NA 139 150* 149* 150* 150*  K 4.1 3.2* 3.3* 3.9 4.2  CL 101 106 105 110 117*  CO2 31 36* 34* 34* 24  GLUCOSE 117* 96 70 168* 76  BUN 48* 44* 38* 32* 32*  CREATININE 1.72* 0.66 0.78 0.62 1.27*  CALCIUM 8.8* 9.0 8.6* 8.2* 7.5*  MG  --  2.4  --   --  1.5*  PHOS  --   --   --   --  2.1*   Liver Function  Tests:  Recent Labs Lab 08/03/2015 2111 08/13/15 0400  AST 20 16  ALT 14 11*  ALKPHOS 76 59  BILITOT 0.9 0.5  PROT 8.1 6.4*  ALBUMIN 4.2 3.3*    Recent Labs Lab 08/10/2015 2111  LIPASE 82*   No results for input(s): AMMONIA in the last 168 hours. CBC:  Recent Labs Lab 08/21/2015 2111  08/13/15 1934 08/14/15 0333 08/15/15 0443 08/03/2015 0500 08/18/15 0458  WBC 21.7*  < > 13.5* 13.0* 12.3* 12.0* 23.2*  NEUTROABS 18.9*   --   --   --   --   --   --   HGB 14.8  < > 12.0 12.5 12.7 10.9* 11.9*  HCT 44.8  < > 37.8 39.5 41.3 35.0* 38.5  MCV 83.3  < > 84.9 85.7 87.1 88.4 89.3  PLT 273  < > 195 200 219 185 176  < > = values in this interval not displayed. Cardiac Enzymes:  Recent Labs Lab 08/13/2015 2111  TROPONINI <0.03   BNP (last 3 results) No results for input(s): BNP in the last 8760 hours.  ProBNP (last 3 results) No results for input(s): PROBNP in the last 8760 hours.  CBG: No results for input(s): GLUCAP in the last 168 hours.  Recent Results (from the past 240 hour(s))  Urine culture     Status: None   Collection Time: 08/18/2015 11:28 PM  Result Value Ref Range Status   Specimen Description URINE, CATHETERIZED  Final   Special Requests NONE  Final   Culture   Final    NO GROWTH 1 DAY Performed at [facility]    Report Status 08/14/2015 FINAL  Final  MRSA PCR Screening     Status: None   Collection Time: 08/13/15  3:31 AM  Result Value Ref Range Status   MRSA by PCR NEGATIVE NEGATIVE Final    Comment:        The GeneXpert MRSA Assay (FDA approved for NASAL specimens only), is one component of a comprehensive MRSA colonization surveillance program. It is not intended to diagnose MRSA infection nor to guide or monitor treatment for MRSA infections.      Studies: Dg Chest Port 1 View  08/18/2015  CLINICAL DATA:  Hypotension.  Shortness of breath. EXAM: PORTABLE CHEST 1 VIEW COMPARISON:  Chest radiograph 08/26/2015. FINDINGS: Enteric tube tip projects over the left upper quadrant, side-port projects within the midchest. Left upper extremity PICC line tip projects over the superior cavoatrial junction. Patient has known large hiatal hernia. Stable enlarged cardiac and mediastinal contours. Low lung volumes. Interval worsening diffuse bilateral interstitial pulmonary opacities. Small bilateral pleural effusions. No definite pneumothorax. IMPRESSION: Enteric tube tip projects in  left upper quadrant, side-port projects in the chest. Recommend clinical correlation for desired positioning given patient's known large hiatal hernia. Interval worsening diffuse bilateral interstitial pulmonary opacities suggestive of worsening pulmonary edema. Low lung volumes.  Small bilateral pleural effusions. These results will be called to the ordering clinician or representative by the Radiologist Assistant, and communication documented in the PACS or zVision Dashboard. Electronically Signed   By: Annia Belt M.D.   On: 08/18/2015 08:35   Dg Chest Port 1 View  08/16/2015  CLINICAL DATA:  Shortness of breath.  Small bowel obstruction. EXAM: PORTABLE CHEST 1 VIEW COMPARISON:  08/05/2015 FINDINGS: There has been placement of enteric catheter, tip at the expected location of gastric cardia and side hole at the level of the distal esophagus. Cardiomediastinal silhouette is normal. Mediastinal contours  appear intact. There is a large hiatal hernia. There is no evidence of pneumothorax. There is compressive atelectasis of bilateral lung bases. Small pleural effusions are noted. Osseous structures are without acute abnormality. Soft tissues are grossly normal. IMPRESSION: Enteric catheter with tip overlying the proximal stomach, in the settings of large hiatal hernia. Bilateral small pleural effusions and bibasilar lower lobe compressive atelectasis. Electronically Signed   By: Ted Mcalpine M.D.   On: 08/22/2015 18:57    Scheduled Meds: . enoxaparin (LOVENOX) injection  30 mg Subcutaneous Q24H   Continuous Infusions: . lactated ringers 150 mL/hr at 08/18/15 0946  . norepinephrine (LEVOPHED) Adult infusion Stopped (08/18/15 0518)  . phenylephrine (NEO-SYNEPHRINE) Adult infusion 80 mcg/min (08/18/15 0807)    Active Problems:   Hypertension   Nausea and vomiting in adult patient   Small bowel obstruction (HCC)   AKI (acute kidney injury) (HCC)   SBO (small bowel obstruction) (HCC)   Atrial  fibrillation with RVR (HCC)    Time spent: 25 minutes. Greater than 50% of this time was spent in direct contact with the patient coordinating care.    Chaya Jan  [facility] Pager [phone]  If 7PM-7AM, please contact night-coverage at [email or web address], password [facility] 08/18/2015, 10:09 AM  LOS: 5 days

## 2015-08-18 NOTE — Progress Notes (Signed)
Attempted to draw another ABG from patient, was not able to obtain. RN notified.

## 2015-08-18 NOTE — Addendum Note (Signed)
Addendum  created 08/18/15 0757 by Earleen Newport, CRNA   Modules edited: Clinical Notes   Clinical Notes:  File: [id number]

## 2015-08-19 ENCOUNTER — Encounter (HOSPITAL_COMMUNITY): Payer: Self-pay | Admitting: General Surgery

## 2015-08-19 ENCOUNTER — Inpatient Hospital Stay (HOSPITAL_COMMUNITY): Payer: Medicare Other

## 2015-08-19 LAB — URINALYSIS, ROUTINE W REFLEX MICROSCOPIC
Bilirubin Urine: NEGATIVE
Glucose, UA: NEGATIVE mg/dL
Ketones, ur: NEGATIVE mg/dL
LEUKOCYTES UA: NEGATIVE
NITRITE: NEGATIVE
Specific Gravity, Urine: 1.01 (ref 1.005–1.030)
pH: 5.5 (ref 5.0–8.0)

## 2015-08-19 LAB — BASIC METABOLIC PANEL
ANION GAP: 7 (ref 5–15)
BUN: 38 mg/dL — ABNORMAL HIGH (ref 6–20)
CHLORIDE: 111 mmol/L (ref 101–111)
CO2: 24 mmol/L (ref 22–32)
CREATININE: 1.51 mg/dL — AB (ref 0.44–1.00)
Calcium: 7.7 mg/dL — ABNORMAL LOW (ref 8.9–10.3)
GFR calc non Af Amer: 30 mL/min — ABNORMAL LOW (ref >60)
GFR, EST AFRICAN AMERICAN: 35 mL/min — AB (ref >60)
Glucose, Bld: 139 mg/dL — ABNORMAL HIGH (ref 65–99)
POTASSIUM: 4 mmol/L (ref 3.5–5.1)
Sodium: 142 mmol/L (ref 135–145)

## 2015-08-19 LAB — URINE MICROSCOPIC-ADD ON

## 2015-08-19 LAB — PHOSPHORUS: Phosphorus: 2.1 mg/dL — ABNORMAL LOW (ref 2.5–4.6)

## 2015-08-19 LAB — CBC
HEMATOCRIT: 35.2 % — AB (ref 36.0–46.0)
HEMOGLOBIN: 11.4 g/dL — AB (ref 12.0–15.0)
MCH: 27.9 pg (ref 26.0–34.0)
MCHC: 32.4 g/dL (ref 30.0–36.0)
MCV: 86.1 fL (ref 78.0–100.0)
Platelets: 166 10*3/uL (ref 150–400)
RBC: 4.09 MIL/uL (ref 3.87–5.11)
RDW: 14.8 % (ref 11.5–15.5)
WBC: 29.6 10*3/uL — ABNORMAL HIGH (ref 4.0–10.5)

## 2015-08-19 LAB — MAGNESIUM: Magnesium: 1.7 mg/dL (ref 1.7–2.4)

## 2015-08-19 MED ORDER — ENOXAPARIN SODIUM 30 MG/0.3ML ~~LOC~~ SOLN
30.0000 mg | SUBCUTANEOUS | Status: DC
  Administered 2015-08-20 – 2015-08-21 (×2): 30 mg via SUBCUTANEOUS
  Filled 2015-08-19 (×2): qty 0.3

## 2015-08-19 MED ORDER — ENOXAPARIN SODIUM 40 MG/0.4ML ~~LOC~~ SOLN: 40.0000 mg | SUBCUTANEOUS | Status: DC

## 2015-08-19 NOTE — Progress Notes (Signed)
PROGRESS NOTE  Priscilla Wu UJW:[id number] DOB: 03-May-1926 DOA: 09/08/2015 PCP: Alice Reichert, MD  Summary: 62 yof with a hx of HTN presented with nausea and vomiting. Abdominal xray and CT scan consistent with SBO and possibly acute pancreatitis. General surgery was consulted who performed an ex lap with partial bowel resection on 1/16. Patients vomiting has resolved however she is now hypotensive with intermittent episodes of atrial fibrillation.   Assessment/Plan: 1. Small bowel obstruction, s/p ex lap with partial bowel resection on 1/16. General surgery following. 2. Hypotension despite aggressive IVF; without fever or evidence of sepsis/infeciton. Continue pressors and monitor. Patient has evidence of mild pulmonary edema so IVF were discontinued.  3. Atrial fibrillation with RVR, now in sinus rhythm. Continue anticoagulation post-operatively however long term use will need to be further discussed with daughter given her age and frailty. She is not on any rate-control medications given her hypotension.  4. AKI, likely ATN secondary to hypotension. Continue to monitor  5. Leukocytosis, worsened today possibly in response to surgery. Will continue to monitor.  6. Questionable acute pancreatitis, seen on abdominal CT. Lipase minimally elevated on admission.   Remains critically ill, on vasopressors post op; condition guarded. Will continue vasopressor, maintenance IVF, check CMP, lipase and CBC in AM.  Check CXR, U/A.  No fever or signs of infection, hold off on abx for present time.  Code Status: DNR per family discussion this AM (patient has desired this previous to and on this admission. DVT prophylaxis: Lovenox Family Communication: Daughters at bedside. Disposition Plan: continue to monitor in ICU.   Brendia Sacks, MD  [facility]  Pager [phone] If 7PM-7AM, please contact night-coverage at [email or web address], password [facility] 08/19/2015, 6:32 AM  LOS: 6 days    Consultants:  General Surgery   Procedures:  Ex Lap and partial small bowel resection 1/16  PICC line placed  ECHO Study Conclusions  - Procedure narrative: Transthoracic echocardiography. Image quality was suboptimal. The study was technically difficult, as a result of poor acoustic windows and restricted patient mobility. - Left ventricle: The cavity size was normal. Wall thickness was increased in a pattern of mild LVH. There is inferoseptal hypokinesis to akinesis, consistent with prior infarct. Systolic function was mildly reduced. The estimated ejection fraction was in the range of 45% to 50%. The study is not technically sufficient to allow evaluation of LV diastolic function. - Aortic valve: Structurally normal valve. Trileaflet. Calcified annulus. Transvalvular velocity was within the normal range. There was no stenosis. There was trivial regurgitation. - Aortic root: The aortic root was top normal in size at 3.8 cm. - Left atrium: The atrium was mildly dilated. - Tricuspid valve: There was mild regurgitation. - Pulmonary arteries: PA peak pressure: 31 mm Hg (S) + RAP. - Systemic veins: Not visualized.  Antibiotics:  none  HPI/Subjective: Feels worn out. Has pain in her stomach. Denies nausea or vomiting. Per daughter, prior to admission patient lived alone and performed most ADLs. The last few days she has appeared confused at times but relatively aware of what is going on.   Objective: Filed Vitals:   08/19/15 0345 08/19/15 0400 08/19/15 0415 08/19/15 0500  BP: 90/48  Pulse:      Temp:  97.7 F (36.5 C)    TempSrc:  Axillary    Resp: 32 Height:      Weight:    70.6 kg (155 lb 10.3 oz)  SpO2:  Intake/Output Summary (Last 24 hours) at 08/19/15 1610 Last data filed at 08/19/15 0500  Gross per 24 hour  Intake 5068.62 ml  Output    575 ml  Net 4493.62 ml     Filed Weights   09/07/2015 1235  08/18/15 0500 08/19/15 0500  Weight: 59.421 kg (131 lb) 65.8 kg (145 lb 1 oz) 70.6 kg (155 lb 10.3 oz)    Exam:     Afebrile, hypotensive, not hypoxic General:  Appears calm and comfortable Eyes: PERRL, normal lids, irises & conjunctiva ENT: grossly normal hearing, lips & tongue Neck: no LAD, masses or thyromegaly Cardiovascular: tachycardic, no m/r/g. No LE edema. Telemetry: sinus tachycardia, 26 beat VT 1/17 Respiratory: CTA bilaterally, no w/r/r. Normal respiratory effort. Abdomen: soft, ntnd, incision appears unremarkable Skin: grossly unremarkable Musculoskeletal: grossly normal tone BUE/BLE. PICC in LUE Psychiatric: grossly normal mood and affect, speech fluent and appropriate Neurologic: grossly non-focal.  New data reviewed:  UOP 450, I/O + 9 L  BUN up to 38, Creatinine up to 1.51  WBC up to 29.6  Pertinent data since admission:  WBC 21.7 on admission  Lactic acid 3.8  Abdominal CT consistent with acute pancreatitis and SBO.    Scheduled Meds: . enoxaparin (LOVENOX) injection  30 mg Subcutaneous Q24H   Continuous Infusions: . lactated ringers 150 mL/hr at 08/19/15 0330  . norepinephrine (LEVOPHED) Adult infusion Stopped (08/18/15 0518)  . phenylephrine (NEO-SYNEPHRINE) Adult infusion 160 mcg/min (08/19/15 0829)    Principal Problem:   SBO (small bowel obstruction) (HCC) Active Problems:   Hypertension   Nausea and vomiting in adult patient   AKI (acute kidney injury) (HCC)   Atrial fibrillation with RVR (HCC)   Hypotension   Time spent 25 minutes   By signing my name below, I, Burnett Harry attest that this documentation has been prepared under the direction and in the presence of Brendia Sacks, MD Electronically signed: Burnett Harry, Scribe.  08/19/2015 9:28am  I personally performed the services described in this documentation. All medical record entries made by the scribe were at my direction. I have reviewed the chart and agree  that the record reflects my personal performance and is accurate and complete. Brendia Sacks, MD

## 2015-08-19 NOTE — Progress Notes (Signed)
2 Days Post-Op  Subjective: Still responsive and without pain.  Objective: Vital signs in last 24 hours: Temp:  [97.7 F (36.5 C)-98.8 F (37.1 C)] 98.4 F (36.9 C) (01/18 1133) Pulse Rate:  [27-103] 27 (01/18 1200) Resp:  [20-41] 28 (01/18 1200) BP: (45-112)/(27-83) 100/55 mmHg (01/18 1200) SpO2:  [75 %-100 %] 100 % (01/18 1200) Weight:  [70.6 kg (155 lb 10.3 oz)] 70.6 kg (155 lb 10.3 oz) (01/18 0500) Last BM Date: 08/27/2015  Intake/Output from previous day: 01/17 0701 - 01/18 0700 In: 4931.7 [I.V.:4931.7] Out: 525 [Urine:450; Emesis/NG output:75] Intake/Output this shift:    General appearance: alert, cooperative, appears stated age and no distress GI: Soft. Incision healing well. Bowel sounds absent.  Lab Results:   Recent Labs  08/18/15 0458 08/19/15 0450  WBC 23.2* 29.6*  HGB 11.9* 11.4*  HCT 38.5 35.2*  PLT 176 166   BMET  Recent Labs  08/18/15 0458 08/19/15 0450  NA 150* 142  K 4.2 4.0  CL 117* 111  CO2 24 24  GLUCOSE 76 139*  BUN 32* 38*  CREATININE 1.27* 1.51*  CALCIUM 7.5* 7.7*   PT/INR No results for input(s): LABPROT, INR in the last 72 hours.  Studies/Results: Dg Chest Port 1 View  08/19/2015  CLINICAL DATA:  Hypotension. EXAM: PORTABLE CHEST 1 VIEW COMPARISON:  08/18/2015. FINDINGS: Left PICC line in stable position. NG tube in stable position. Tip is in the upper portion of the stomach. Distal repositioning of approximately 8 cm should be considered Cardiomegaly with diffuse bilateral pulmonary alveolar infiltrates noted consistent with pulmonary edema. No interim change. Bilateral pleural effusions noted. No pneumothorax. IMPRESSION: 1. Left PICC line and NG tube in stable position. NG tube tip is in the upper portion stomach. Advancement of approximately 8 cm should be considered. 2. Congestive heart failure with bilateral pulmonary edema and bilateral pleural effusions. No interim clearing from prior exam. These results will be called to the  ordering clinician or representative by the Radiologist Assistant, and communication documented in the PACS or zVision Dashboard. Electronically Signed   By: Maisie Fus  Register   On: 08/19/2015 11:12   Dg Chest Port 1 View  08/18/2015  CLINICAL DATA:  Hypotension.  Shortness of breath. EXAM: PORTABLE CHEST 1 VIEW COMPARISON:  Chest radiograph 08/15/2015. FINDINGS: Enteric tube tip projects over the left upper quadrant, side-port projects within the midchest. Left upper extremity PICC line tip projects over the superior cavoatrial junction. Patient has known large hiatal hernia. Stable enlarged cardiac and mediastinal contours. Low lung volumes. Interval worsening diffuse bilateral interstitial pulmonary opacities. Small bilateral pleural effusions. No definite pneumothorax. IMPRESSION: Enteric tube tip projects in left upper quadrant, side-port projects in the chest. Recommend clinical correlation for desired positioning given patient's known large hiatal hernia. Interval worsening diffuse bilateral interstitial pulmonary opacities suggestive of worsening pulmonary edema. Low lung volumes.  Small bilateral pleural effusions. These results will be called to the ordering clinician or representative by the Radiologist Assistant, and communication documented in the PACS or zVision Dashboard. Electronically Signed   By: Annia Belt M.D.   On: 08/18/2015 08:35   Dg Chest Port 1 View  08/08/2015  CLINICAL DATA:  Shortness of breath.  Small bowel obstruction. EXAM: PORTABLE CHEST 1 VIEW COMPARISON:  08/23/2015 FINDINGS: There has been placement of enteric catheter, tip at the expected location of gastric cardia and side hole at the level of the distal esophagus. Cardiomediastinal silhouette is normal. Mediastinal contours appear intact. There is a large hiatal  hernia. There is no evidence of pneumothorax. There is compressive atelectasis of bilateral lung bases. Small pleural effusions are noted. Osseous structures are  without acute abnormality. Soft tissues are grossly normal. IMPRESSION: Enteric catheter with tip overlying the proximal stomach, in the settings of large hiatal hernia. Bilateral small pleural effusions and bibasilar lower lobe compressive atelectasis. Electronically Signed   By: Ted Mcalpine M.D.   On: 09/01/2015 18:57   Dg Chest Port 1v Same Day  08/18/2015  CLINICAL DATA:  Endotracheal tube further advanced EXAM: PORTABLE CHEST 1 VIEW COMPARISON:  August 18, 2015 8:13 a.m. FINDINGS: The heart size and mediastinal contours are stable. There consolidation of both lung bases. There is probable left pleural effusion. There is pulmonary edema unchanged. Endotracheal tube is identified with distal tip not significantly change from an earlier exam today. Left central venous line is unchanged. The visualized skeletal structures are stable P IMPRESSION: Nasogastric tube is identified with distal tip not significantly changed from an earlier exam today, 8:13 a.m. Consolidation of bilateral lung bases with left pleural effusion. Pulmonary edema is unchanged. These results will be called to the ordering clinician or representative by the Radiologist Assistant, and communication documented in the PACS or zVision Dashboard. Electronically Signed   By: Sherian Rein M.D.   On: 08/18/2015 10:52    Anti-infectives: Anti-infectives    None      Assessment/Plan: s/p Procedure(s): EXPLORATORY LAPAROTOMY  PARTIAL SMALL BOWEL RESECTION Impression: Postoperative day 2, status post partial small bowel resection due to adhesive disease. Leukocytosis still very concerning. We may need to empirically start on antibiotics should her leukocytosis worsened. Will watch her pulmonary status closely. Would not advance her diet at this time.  LOS: 6 days    Priscilla Wu A 08/19/2015

## 2015-08-20 ENCOUNTER — Inpatient Hospital Stay (HOSPITAL_COMMUNITY): Payer: Medicare Other

## 2015-08-20 LAB — CBC
HCT: 34.9 % — ABNORMAL LOW (ref 36.0–46.0)
HEMOGLOBIN: 11.2 g/dL — AB (ref 12.0–15.0)
MCH: 27.5 pg (ref 26.0–34.0)
MCHC: 32.1 g/dL (ref 30.0–36.0)
MCV: 85.7 fL (ref 78.0–100.0)
PLATELETS: 164 10*3/uL (ref 150–400)
RBC: 4.07 MIL/uL (ref 3.87–5.11)
RDW: 14.9 % (ref 11.5–15.5)
WBC: 37.1 10*3/uL — AB (ref 4.0–10.5)

## 2015-08-20 LAB — COMPREHENSIVE METABOLIC PANEL
ALBUMIN: 1.5 g/dL — AB (ref 3.5–5.0)
ALK PHOS: 97 U/L (ref 38–126)
ALT: 19 U/L (ref 14–54)
AST: 25 U/L (ref 15–41)
Anion gap: 7 (ref 5–15)
BUN: 36 mg/dL — ABNORMAL HIGH (ref 6–20)
CALCIUM: 8 mg/dL — AB (ref 8.9–10.3)
CHLORIDE: 106 mmol/L (ref 101–111)
CO2: 27 mmol/L (ref 22–32)
CREATININE: 1.44 mg/dL — AB (ref 0.44–1.00)
GFR calc non Af Amer: 32 mL/min — ABNORMAL LOW (ref >60)
GFR, EST AFRICAN AMERICAN: 37 mL/min — AB (ref >60)
GLUCOSE: 113 mg/dL — AB (ref 65–99)
Potassium: 3.6 mmol/L (ref 3.5–5.1)
SODIUM: 140 mmol/L (ref 135–145)
Total Bilirubin: 0.6 mg/dL (ref 0.3–1.2)
Total Protein: 4 g/dL — ABNORMAL LOW (ref 6.5–8.1)

## 2015-08-20 LAB — LIPASE, BLOOD: Lipase: 44 U/L (ref 11–51)

## 2015-08-20 MED ORDER — SODIUM CHLORIDE 0.9 % IV SOLN
80.0000 mg | Freq: Once | INTRAVENOUS | Status: AC
  Administered 2015-08-20: 80 mg via INTRAVENOUS
  Filled 2015-08-20: qty 80

## 2015-08-20 MED ORDER — LEVOFLOXACIN IN D5W 500 MG/100ML IV SOLN: 500.0000 mg | INTRAVENOUS | Status: DC

## 2015-08-20 MED ORDER — PANTOPRAZOLE SODIUM 40 MG IV SOLR
INTRAVENOUS | Status: AC
  Filled 2015-08-20: qty 80

## 2015-08-20 MED ORDER — METRONIDAZOLE IN NACL 5-0.79 MG/ML-% IV SOLN
500.0000 mg | Freq: Three times a day (TID) | INTRAVENOUS | Status: DC
  Administered 2015-08-20 – 2015-08-27 (×21): 500 mg via INTRAVENOUS
  Filled 2015-08-20 (×21): qty 100

## 2015-08-20 MED ORDER — PANTOPRAZOLE SODIUM 40 MG IV SOLR
40.0000 mg | Freq: Two times a day (BID) | INTRAVENOUS | Status: DC
  Administered 2015-08-24: 40 mg via INTRAVENOUS
  Filled 2015-08-20: qty 40

## 2015-08-20 MED ORDER — METRONIDAZOLE IN NACL 5-0.79 MG/ML-% IV SOLN
500.0000 mg | Freq: Once | INTRAVENOUS | Status: AC
  Administered 2015-08-20: 500 mg via INTRAVENOUS
  Filled 2015-08-20: qty 100

## 2015-08-20 MED ORDER — PHENYLEPHRINE HCL 10 MG/ML IJ SOLN
INTRAMUSCULAR | Status: AC
  Filled 2015-08-20: qty 4

## 2015-08-20 MED ORDER — LEVOFLOXACIN IN D5W 750 MG/150ML IV SOLN
750.0000 mg | Freq: Once | INTRAVENOUS | Status: AC
  Administered 2015-08-20: 750 mg via INTRAVENOUS
  Filled 2015-08-20: qty 150

## 2015-08-20 MED ORDER — SODIUM CHLORIDE 0.9 % IV SOLN
8.0000 mg/h | INTRAVENOUS | Status: AC
  Administered 2015-08-20 – 2015-08-23 (×7): 8 mg/h via INTRAVENOUS
  Filled 2015-08-20 (×8): qty 80

## 2015-08-20 NOTE — Progress Notes (Signed)
DO NOT RESUSCITATE status noted. No CPR, intubation, or further surgery. Abdomen is soft and nondistended. Incision healing well. Patient is still in critical condition due to hypovolemic shock from her bowel obstruction and surgery. Her prognosis is poor. Will follow with you.

## 2015-08-20 NOTE — Progress Notes (Signed)
PROGRESS NOTE  Priscilla Wu WNU:[id number] DOB: [birth year] DOA: 08/25/2015 PCP: Alice Reichert, MD  Summary: 56 yof with a hx of HTN presented with nausea and vomiting. Abdominal xray and CT scan consistent with SBO and possibly acute pancreatitis. General surgery was consulted who performed an ex lap with partial bowel resection on 1/16. Patients vomiting has resolved however she is now hypotensive with intermittent episodes of atrial fibrillation.   Assessment/Plan: 1. Small bowel obstruction, s/p ex lap with partial bowel resection on 1/16. General surgery following. 2. Hypotension despite aggressive IVF; without fever or evidence of sepsis/infeciton. Stable on pressors.   3. Atrial fibrillation with RVR, remains in sinus rhythm. She is not on any rate-control medications given her hypotension. Poor candidate for anticoagulation. 4. AKI, likely ATN secondary to hypotension. Improving. Continue to monitor  5. Leukocytosis, worsened today, possibly in response to surgery. CXR unremarkable. LFTs wnl. Will continue to monitor. 6. Questionable acute pancreatitis, seen on abdominal CT. Lipase minimally elevated on admission, now WNL. Asymptomatic.   Remains critically ill, on vasopressor with ongoing hypotension, peripheral edema, elevated WBC. Difficult to manage BP, fluid balance and AKI.  Discussed with Dr. Lovell Sheehan, no evidence of infection. Will start on IV abx empirically given worsening leukocytosis.   Code Status: DNR confirmed with POA at bedside today DVT prophylaxis: Lovenox Family Communication: Son Artus- POA and grandson at bedside. Discussed guarded prognosis, my not survive this hospitalization. Disposition Plan: Continue to monitor in ICU.   Brendia Sacks, MD  [facility]  Pager [phone] If 7PM-7AM, please contact night-coverage at [email or web address], password [facility] 08/20/2015, 6:28 AM  LOS: 7 days   Consultants:  General Surgery   Procedures:  Ex Lap and  partial small bowel resection 1/16  PICC line placed  ECHO Study Conclusions  - Procedure narrative: Transthoracic echocardiography. Image quality was suboptimal. The study was technically difficult, as a result of poor acoustic windows and restricted patient mobility. - Left ventricle: The cavity size was normal. Wall thickness was increased in a pattern of mild LVH. There is inferoseptal hypokinesis to akinesis, consistent with prior infarct. Systolic function was mildly reduced. The estimated ejection fraction was in the range of 45% to 50%. The study is not technically sufficient to allow evaluation of LV diastolic function. - Aortic valve: Structurally normal valve. Trileaflet. Calcified annulus. Transvalvular velocity was within the normal range. There was no stenosis. There was trivial regurgitation. - Aortic root: The aortic root was top normal in size at 3.8 cm. - Left atrium: The atrium was mildly dilated. - Tricuspid valve: There was mild regurgitation. - Pulmonary arteries: PA peak pressure: 31 mm Hg (S) + RAP. - Systemic veins: Not visualized.  Antibiotics:  none  HPI/Subjective: Feels pretty good. Breathing is okay. Has some nausea and abdominal pain but denies vomiting.    Objective: Filed Vitals:   08/20/15 0300 08/20/15 0400 08/20/15 0500 08/20/15 0600  BP: 102/73  Pulse: 100 52 103 101  Temp:      TempSrc:      Resp: Height:      Weight:      SpO2: 100% 100% 100% 100%    Intake/Output Summary (Last 24 hours) at 08/20/15 0628 Last data filed at 08/20/15 0600  Gross per 24 hour  Intake   4550 ml  Output    825 ml  Net   3725 ml     Filed Weights   2015/09/12 1235 08/18/15 0500 08/19/15 0500  Weight: 59.421 kg (131 lb) 65.8 kg (145 lb 1 oz) 70.6 kg (155 lb 10.3 oz)    Exam:     Afebrile, not hypoxic General:  Appears calm and comfortable; ill but not toxic Eyes: PERRL, normal lids, irises     ENT: grossly normal hearing, lips   Neck: no LAD, masses or thyromegaly Cardiovascular: RRR, no m/r/g.  2+ Bilateral upper extremity edema, non-pitting. 1+ bilateral thigh edema  Telemetry: SR, no arrhythmias  Respiratory: CTA bilaterally, no w/r/r. Normal respiratory effort. Abdomen: soft, ntnd. Midline incision is well approximated with no drainage.  Skin: no rash or induration noted Musculoskeletal: grossly normal tone BUE/BLE  Psychiatric: grossly normal mood and affect, follows ocmmands Neurologic: grossly non-focal.   New data reviewed:  BUN and Creatinine improved 36/1.44  WBC up to 37.1  LFTs unremarkable  UOP 950  Pertinent data since admission:  WBC 21.7 on admission  Lactic acid 3.8  Abdominal CT consistent with acute pancreatitis and SBO.    Scheduled Meds: . enoxaparin (LOVENOX) injection  30 mg Subcutaneous Q24H   Continuous Infusions: . lactated ringers 125 mL/hr at 08/20/15 0600  . phenylephrine (NEO-SYNEPHRINE) Adult infusion 160 mcg/min (08/20/15 0600)    Principal Problem:   SBO (small bowel obstruction) (HCC) Active Problems:   Hypertension   Nausea and vomiting in adult patient   AKI (acute kidney injury) (HCC)   Atrial fibrillation with RVR (HCC)   Hypotension   Time spent 30 minutes   By signing my name below, I, Burnett Harry attest that this documentation has been prepared under the direction and in the presence of Brendia Sacks, MD Electronically signed: Burnett Harry, Scribe.  08/20/2015 8:59am   I personally performed the services described in this documentation. All medical record entries made by the scribe were at my direction. I have reviewed the chart and agree that the record reflects my personal performance and is accurate and complete. Brendia Sacks, MD

## 2015-08-20 NOTE — Progress Notes (Signed)
ANTIBIOTIC CONSULT NOTE - INITIAL  Pharmacy Consult for Levaquin and Flagyl Indication: intra-abdominal infxn, r/o pna  Allergies  Allergen Reactions  . Penicillins Hives and Rash    Has patient had a PCN reaction causing immediate rash, facial/tongue/throat swelling, SOB or lightheadedness with hypotension: Yes Has patient had a PCN reaction causing severe rash involving mucus membranes or skin necrosis: No Has patient had a PCN reaction that required hospitalization No Has patient had a PCN reaction occurring within the last 10 years: No If all of the above answers are "NO", then may proceed with Cephalosporin use.    Patient Measurements: Height:  (167.6 cm) Weight: 154 lb 15.7 oz (70.3 kg) IBW/kg (Calculated) : 59.3  Vital Signs: Temp: 98.1 F (36.7 C) (01/19 0758) Temp Source: Axillary (01/19 0758) BP: 102/73 mmHg (01/19 0600) Pulse Rate: 101 (01/19 0600) Intake/Output from previous day: 01/18 0701 - 01/19 0700 In: 4550 [I.V.:4550] Out: 1875 [Urine:950; Emesis/NG output:925] Intake/Output from this shift:    Labs:  Recent Labs  08/18/15 0458 08/19/15 0450 08/20/15 0445  WBC 23.2* 29.6* 37.1*  HGB 11.9* 11.4* 11.2*  PLT 176 166 164  CREATININE 1.27* 1.51* 1.44*   Estimated Creatinine Clearance: 25.8 mL/min (by C-G formula based on Cr of 1.44). No results for input(s): VANCOTROUGH, VANCOPEAK, VANCORANDOM, GENTTROUGH, GENTPEAK, GENTRANDOM, TOBRATROUGH, TOBRAPEAK, TOBRARND, AMIKACINPEAK, AMIKACINTROU, AMIKACIN in the last 72 hours.   Microbiology: Recent Results (from the past 720 hour(s))  Urine culture     Status: None   Collection Time: 08-14-2015 11:28 PM  Result Value Ref Range Status   Specimen Description URINE, CATHETERIZED  Final   Special Requests NONE  Final   Culture   Final    NO GROWTH 1 DAY Performed at [facility]    Report Status 08/14/2015 FINAL  Final  MRSA PCR Screening     Status: None   Collection Time: 08/13/15  3:31 AM   Result Value Ref Range Status   MRSA by PCR NEGATIVE NEGATIVE Final    Comment:        The GeneXpert MRSA Assay (FDA approved for NASAL specimens only), is one component of a comprehensive MRSA colonization surveillance program. It is not intended to diagnose MRSA infection nor to guide or monitor treatment for MRSA infections.    Medical History: Past Medical History  Diagnosis Date  . Hypertension   . UTI (lower urinary tract infection)   . Neck pain   . Headache    Anti-infectives    Start     Dose/Rate Route Frequency Ordered Stop   08/22/15 1000  levofloxacin (LEVAQUIN) IVPB 500 mg     500 mg 100 mL/hr over 60 Minutes Intravenous Every 48 hours 08/20/15 1001     08/20/15 1800  metroNIDAZOLE (FLAGYL) IVPB 500 mg     500 mg 100 mL/hr over 60 Minutes Intravenous Every 8 hours 08/20/15 0958     08/20/15 1000  levofloxacin (LEVAQUIN) IVPB 750 mg     750 mg 100 mL/hr over 90 Minutes Intravenous  Once 08/20/15 0937 08/20/15 1140   08/20/15 1000  metroNIDAZOLE (FLAGYL) IVPB 500 mg     500 mg 100 mL/hr over 60 Minutes Intravenous  Once 08/20/15 0981       Assessment: 82yo female w/ SBO s/p partial bowel resection 1/16, leukocytosis.  Estimated Creatinine Clearance: 25.8 mL/min (by C-G formula based on Cr of 1.44).  Goal of Therapy:  Eradicate infection.  Plan:  Flagyl  IV q8h (renal adjustment  not needed) Levaquin  IV q48hrs Monitor progress and c/s  Margo Aye, Perley Arthurs A 08/20/2015,11:54 AM

## 2015-08-21 ENCOUNTER — Inpatient Hospital Stay (HOSPITAL_COMMUNITY): Payer: Medicare Other

## 2015-08-21 DIAGNOSIS — D696 Thrombocytopenia, unspecified: Secondary | ICD-10-CM

## 2015-08-21 DIAGNOSIS — K922 Gastrointestinal hemorrhage, unspecified: Secondary | ICD-10-CM

## 2015-08-21 DIAGNOSIS — D72829 Elevated white blood cell count, unspecified: Secondary | ICD-10-CM

## 2015-08-21 DIAGNOSIS — D62 Acute posthemorrhagic anemia: Secondary | ICD-10-CM

## 2015-08-21 DIAGNOSIS — E8770 Fluid overload, unspecified: Secondary | ICD-10-CM

## 2015-08-21 DIAGNOSIS — K5669 Other intestinal obstruction: Secondary | ICD-10-CM

## 2015-08-21 DIAGNOSIS — E43 Unspecified severe protein-calorie malnutrition: Secondary | ICD-10-CM

## 2015-08-21 DIAGNOSIS — E876 Hypokalemia: Secondary | ICD-10-CM

## 2015-08-21 LAB — BASIC METABOLIC PANEL
ANION GAP: 5 (ref 5–15)
Anion gap: 6 (ref 5–15)
BUN: 28 mg/dL — ABNORMAL HIGH (ref 6–20)
BUN: 33 mg/dL — ABNORMAL HIGH (ref 6–20)
CALCIUM: 6.5 mg/dL — AB (ref 8.9–10.3)
CHLORIDE: 106 mmol/L (ref 101–111)
CO2: 22 mmol/L (ref 22–32)
CO2: 26 mmol/L (ref 22–32)
CREATININE: 1.19 mg/dL — AB (ref 0.44–1.00)
Calcium: 7.6 mg/dL — ABNORMAL LOW (ref 8.9–10.3)
Chloride: 100 mmol/L — ABNORMAL LOW (ref 101–111)
Creatinine, Ser: 1.26 mg/dL — ABNORMAL HIGH (ref 0.44–1.00)
GFR calc Af Amer: 43 mL/min — ABNORMAL LOW (ref >60)
GFR calc non Af Amer: 40 mL/min — ABNORMAL LOW (ref >60)
GFR, EST AFRICAN AMERICAN: 46 mL/min — AB (ref >60)
GFR, EST NON AFRICAN AMERICAN: 37 mL/min — AB (ref >60)
GLUCOSE: 92 mg/dL (ref 65–99)
Glucose, Bld: 408 mg/dL — ABNORMAL HIGH (ref 65–99)
POTASSIUM: 4.4 mmol/L (ref 3.5–5.1)
Potassium: 3 mmol/L — ABNORMAL LOW (ref 3.5–5.1)
SODIUM: 128 mmol/L — AB (ref 135–145)
Sodium: 137 mmol/L (ref 135–145)

## 2015-08-21 LAB — CBC
HCT: 29.2 % — ABNORMAL LOW (ref 36.0–46.0)
HCT: 29.7 % — ABNORMAL LOW (ref 36.0–46.0)
HEMATOCRIT: 32.3 % — AB (ref 36.0–46.0)
HEMOGLOBIN: 10.5 g/dL — AB (ref 12.0–15.0)
Hemoglobin: 9.4 g/dL — ABNORMAL LOW (ref 12.0–15.0)
Hemoglobin: 9.7 g/dL — ABNORMAL LOW (ref 12.0–15.0)
MCH: 27.3 pg (ref 26.0–34.0)
MCH: 27.4 pg (ref 26.0–34.0)
MCH: 27.5 pg (ref 26.0–34.0)
MCHC: 32.2 g/dL (ref 30.0–36.0)
MCHC: 32.5 g/dL (ref 30.0–36.0)
MCHC: 32.7 g/dL (ref 30.0–36.0)
MCV: 84.9 fL (ref 78.0–100.0)
MCV: 84.3 fL (ref 78.0–100.0)
MCV: 84.1 fL (ref 78.0–100.0)
PLATELETS: 125 10*3/uL — AB (ref 150–400)
Platelets: 127 10*3/uL — ABNORMAL LOW (ref 150–400)
Platelets: 124 10*3/uL — ABNORMAL LOW (ref 150–400)
RBC: 3.44 MIL/uL — ABNORMAL LOW (ref 3.87–5.11)
RBC: 3.83 MIL/uL — ABNORMAL LOW (ref 3.87–5.11)
RBC: 3.53 MIL/uL — AB (ref 3.87–5.11)
RDW: 14.9 % (ref 11.5–15.5)
RDW: 15 % (ref 11.5–15.5)
RDW: 14.8 % (ref 11.5–15.5)
WBC: 35.5 10*3/uL — ABNORMAL HIGH (ref 4.0–10.5)
WBC: 40.1 10*3/uL — ABNORMAL HIGH (ref 4.0–10.5)
WBC: 33.5 10*3/uL — ABNORMAL HIGH (ref 4.0–10.5)

## 2015-08-21 LAB — GLUCOSE, CAPILLARY: GLUCOSE-CAPILLARY: 109 mg/dL — AB (ref 65–99)

## 2015-08-21 MED ORDER — ALTEPLASE 2 MG IJ SOLR
INTRAMUSCULAR | Status: AC
  Filled 2015-08-21: qty 2

## 2015-08-21 MED ORDER — DIATRIZOATE MEGLUMINE & SODIUM 66-10 % PO SOLN
ORAL | Status: AC
  Administered 2015-08-21: 473 mL
  Filled 2015-08-21: qty 30

## 2015-08-21 MED ORDER — POTASSIUM CHLORIDE 10 MEQ/100ML IV SOLN
10.0000 meq | INTRAVENOUS | Status: AC
  Administered 2015-08-21 (×4): 10 meq via INTRAVENOUS
  Filled 2015-08-21 (×4): qty 100

## 2015-08-21 MED ORDER — PHENYLEPHRINE HCL 10 MG/ML IJ SOLN
INTRAMUSCULAR | Status: AC
  Filled 2015-08-21: qty 4

## 2015-08-21 MED ORDER — DIGOXIN 0.25 MG/ML IJ SOLN
0.1250 mg | Freq: Every day | INTRAMUSCULAR | Status: DC
  Administered 2015-08-22 – 2015-08-24 (×3): 0.125 mg via INTRAVENOUS
  Filled 2015-08-21 (×3): qty 2

## 2015-08-21 MED ORDER — ALTEPLASE 2 MG IJ SOLR
2.0000 mg | Freq: Once | INTRAMUSCULAR | Status: AC
  Administered 2015-08-21: 2 mg
  Filled 2015-08-21: qty 2

## 2015-08-21 MED ORDER — ALBUMIN HUMAN 25 % IV SOLN
25.0000 g | Freq: Once | INTRAVENOUS | Status: AC
  Administered 2015-08-21: 25 g via INTRAVENOUS
  Filled 2015-08-21: qty 100

## 2015-08-21 MED ORDER — TRACE MINERALS CR-CU-MN-SE-ZN 10-1000-500-60 MCG/ML IV SOLN
INTRAVENOUS | Status: AC
  Administered 2015-08-21: 18:00:00 via INTRAVENOUS
  Filled 2015-08-21: qty 960

## 2015-08-21 MED ORDER — INSULIN ASPART 100 UNIT/ML ~~LOC~~ SOLN
0.0000 [IU] | SUBCUTANEOUS | Status: DC
  Administered 2015-08-22: 2 [IU] via SUBCUTANEOUS
  Administered 2015-08-22: 1 [IU] via SUBCUTANEOUS
  Administered 2015-08-22: 2 [IU] via SUBCUTANEOUS
  Administered 2015-08-22: 1 [IU] via SUBCUTANEOUS
  Administered 2015-08-22 – 2015-08-23 (×3): 2 [IU] via SUBCUTANEOUS
  Administered 2015-08-23: 1 [IU] via SUBCUTANEOUS

## 2015-08-21 MED ORDER — FUROSEMIDE 10 MG/ML IJ SOLN
20.0000 mg | Freq: Once | INTRAMUSCULAR | Status: AC
  Administered 2015-08-21: 20 mg via INTRAVENOUS
  Filled 2015-08-21: qty 2

## 2015-08-21 MED ORDER — LEVOFLOXACIN IN D5W 750 MG/150ML IV SOLN
750.0000 mg | INTRAVENOUS | Status: DC
  Administered 2015-08-22 – 2015-08-26 (×3): 750 mg via INTRAVENOUS
  Filled 2015-08-21 (×5): qty 150

## 2015-08-21 NOTE — Care Management Note (Signed)
Case Management Note  Patient Details  Name: Priscilla Wu MRN: [id number] Date of Birth: 1925-12-29  Expected Discharge Date:  08/15/15               Expected Discharge Plan:  Home w Home Health Services  In-House Referral:  NA  Discharge planning Services  CM Consult  Post Acute Care Choice:  Home Health Choice offered to:  Patient  DME Arranged:    DME Agency:     HH Arranged:    HH Agency:     Status of Service:  In process, will continue to follow  Medicare Important Message Given:  Yes Date Medicare IM Given:    Medicare IM give by:    Date Additional Medicare IM Given:    Additional Medicare Important Message give by:     If discussed at Long Length of Stay Meetings, dates discussed:    Additional Comments: Pt continues to require vasopressors. Will cont to follow for DC planning.  Malcolm Metro, RN 08/21/2015, 11:13 AM

## 2015-08-21 NOTE — Care Management Important Message (Signed)
Important Message  Patient Details  Name: Priscilla Wu MRN: [id number] Date of Birth: [birth year]   Medicare Important Message Given:  Yes    Malcolm Metro, RN 08/21/2015, 11:12 AM

## 2015-08-21 NOTE — Progress Notes (Signed)
Shift Note:  Patient had dark bloody output from her NG Tube within an hour of her flush.  MD paged and PPI started for post-op (see MAR).  Patient now has light green output from her NG Tube.  Patient had increase weeping from arms especially around her left PICC arm.  Chest xray taken to ensure that PICC had not moved since blood return was hard to obtain til this morning.  Vascular access team called and will be here around 1000 to see if an internal Jugular line will be more appropriate.

## 2015-08-21 NOTE — Progress Notes (Signed)
PARENTERAL NUTRITION CONSULT NOTE  Pharmacy Consult for TPN Indication: SBO, s/p ex lap with partial bowel resection on 1/16  Allergies  Allergen Reactions  . Penicillins Hives and Rash    Has patient had a PCN reaction causing immediate rash, facial/tongue/throat swelling, SOB or lightheadedness with hypotension: Yes Has patient had a PCN reaction causing severe rash involving mucus membranes or skin necrosis: No Has patient had a PCN reaction that required hospitalization No Has patient had a PCN reaction occurring within the last 10 years: No If all of the above answers are "NO", then may proceed with Cephalosporin use.    Patient Measurements: Height:  (167.6 cm) Weight: 156 lb 15.5 oz (71.2 kg) IBW/kg (Calculated) : 59.3 Adjusted Body Weight: 62.3 KG Usual Weight:   Vital Signs: Temp: 97 F (36.1 C) (01/20 1215) Temp Source: Oral (01/20 1215) BP: 102/68 mmHg (01/20 1215) Pulse Rate: 86 (01/20 1215) Intake/Output from previous day: 01/19 0701 - 01/20 0700 In: 9604 [I.V.:2974; NG/GT:150; IV Piggyback:500] Out: 1600 [Urine:1100; Emesis/NG output:500] Intake/Output from this shift:   Labs:  Recent Labs  08/20/15 0445 08/21/15 0409 08/21/15 1104  WBC 37.1* 35.5* 40.1*  HGB 11.2* 9.4* 10.5*  HCT 34.9* 29.2* 32.3*  PLT 164 127* 124*    Recent Labs  08/19/15 0450 08/20/15 0445 08/21/15 0409 08/21/15 1104  NA 142 140 128* 137  K 4.0 3.6 3.0* 4.4  CL 111 106 100* 106  CO2 GLUCOSE 139* 113* 408* 92  BUN 38* 36* 28* 33*  CREATININE 1.51* 1.44* 1.19* 1.26*  CALCIUM 7.7* 8.0* 6.5* 7.6*  MG 1.7  --   --   --   PHOS 2.1*  --   --   --   PROT  --  4.0*  --   --   ALBUMIN  --  1.5*  --   --   AST  --  25  --   --   ALT  --  19  --   --   ALKPHOS  --  97  --   --   BILITOT  --  0.6  --   --    Estimated Creatinine Clearance: 31.8 mL/min (by C-G formula based on Cr of 1.26).   No results for input(s): GLUCAP in the last 72 hours.  Medical  History: Past Medical History  Diagnosis Date  . Hypertension   . UTI (lower urinary tract infection)   . Neck pain   . Headache    Medications:  Scheduled:  . [START ON 08/22/2015] levofloxacin (LEVAQUIN) IV  500 mg Intravenous Q48H  . metronidazole  500 mg Intravenous Q8H  . [START ON 08/24/2015] pantoprazole (PROTONIX) IV  40 mg Intravenous Q12H   Infusions:  . lactated ringers 1,000 mL (08/20/15 1457)  . pantoprozole (PROTONIX) infusion 8 mg/hr (08/21/15 1114)  . phenylephrine (NEO-SYNEPHRINE) Adult infusion 150 mcg/min (08/21/15 1136)   Insulin Requirements in the past 24 hours:  None  Current Nutrition:  NPO   Assessment: Okay for Protocol, Critically ill, on vasopressor with ongoing hypotension, peripheral edema, elevated WBC. Difficult to manage BP and fluid balance.  BMP from this AM repeated, updated results noted.  Lytes WNL, Ca++ corrects to 9.6 when account for low albumin.  Usual Nutritional Goals:  2150 kCal, 110 grams of protein per day (will use 80% of estimated needs due to critical illness until ICU stay > 7 days per protocol)  Plan:  Start Clinimix E 5/15 @  72ml/hr Standard Labs No lipids at this time due to critical illness / ICCU status. SSI   Mady Gemma 08/21/2015,12:37 PM

## 2015-08-21 NOTE — Progress Notes (Signed)
Referring Provider: Brendia Sacks, MD  Primary Gastroenterologist:  Dr. Karilyn Cota  Reason for Consultation:    Possible UGI Bleed.  HPI:   Patient is 82 year old Caucasian female who was admitted 8 days ago with 2 day history of nausea and vomiting and diagnosed with small bowel obstruction and she did not respond to medical therapy. She underwent exploratory laparotomy by Dr. Lovell Sheehan on 08/30/2015. She was noted to have multiple small bowel diverticula as well as small bowel obstruction secondary to adhesions with ischemic injury to small bowel and 6 inches resected with primary anastomosis. Postop she has required norepinephrine which was begun on 08/18/2015. NG tube has not been removed because of postop ileus. This morning she was noted to have fresh blood in NG tube and according to nursing staff it has cleared spontaneously. Enoxaparin discontinued by Dr. Irene Limbo. [place] to patient's daughter [name] who is at bedside patient has had problems with heartburn for years but symptoms have been controlled with medication. She is on pantoprazole IV at the present time. There is no history of peptic ulcer disease. She did not have hematemesis with vomiting spell that she had prior to admission. Patient states she has abdominal pain only when she is turned in bed. She has not experienced melena or rectal bleeding or diarrhea. She also has been afebrile during this admission. She is on parenteral nutrition while she is nothing by mouth. Patient's WBC on presentation was 21.7 and H&H was 14.8 and 44.8. Her BUN was 44 and creatinine was 2.32. WBC dropped to 12.3 on 08/15/2015 BUN and creatinine was down to 44 and 0.66 respectively. She is presently on Levaquin and metronidazole IV. Patient is widowed. She lives alone. She drives around in town and takes care of her house. She has 3 daughters and 2 sons. She retired from [employer] when she was 82 years old and has enjoyed good health. She does not  smoke cigarettes or drink alcohol.    Past Medical History  Diagnosis Date  . Hypertension   . UTI (lower urinary tract infection)   . Neck pain   . Headache     Past Surgical History  Procedure Laterality Date  . Appendectomy    . Cataract extraction Bilateral 2010  . Laparotomy N/A 08/10/2015    Procedure: EXPLORATORY LAPAROTOMY ;  Surgeon: [name], MD;  Location: [facility];  Service: General;  Laterality: N/A;  . Bowel resection N/A 08/09/2015    Procedure: PARTIAL SMALL BOWEL RESECTION;  Surgeon: [name], MD;  Location: [facility];  Service: General;  Laterality: N/A;    Prior to Admission medications   Medication Sig Start Date End Date Taking? Authorizing Provider  lisinopril (PRINIVIL,ZESTRIL) 20 MG tablet Take 20 mg by mouth at bedtime.   Yes Historical Provider, MD  Multiple Vitamin (MULTIVITAMIN) tablet Take 1 tablet by mouth at bedtime.     Historical Provider, MD  omeprazole (PRILOSEC OTC) 20 MG tablet Take 20 mg by mouth at bedtime.     Historical Provider, MD    Current Facility-Administered Medications  Medication Dose Route Frequency Provider Last Rate Last Dose  . Marland KitchenTPN (CLINIMIX-E) Adult   Intravenous Continuous TPN Standley Brooking, MD      . acetaminophen (TYLENOL) tablet 650 mg  650 mg Oral Q6H PRN [name], MD       Or  . acetaminophen (TYLENOL) suppository 650 mg  650 mg Rectal Q6H PRN [name], MD      . insulin  aspart (novoLOG) injection 0-9 Units  0-9 Units Subcutaneous 6 times per day Standley Brooking, MD      . ipratropium (ATROVENT) nebulizer solution 0.5 mg  0.5 mg Nebulization Q2H PRN [name], MD      . lactated ringers infusion   Intravenous Continuous Standley Brooking, MD 50 mL/hr at 08/20/15 1457 1,000 mL at 08/20/15 1457  . [START ON 08/22/2015] levofloxacin (LEVAQUIN) IVPB 750 mg  750 mg Intravenous Q48H Standley Brooking, MD      . metroNIDAZOLE (FLAGYL) IVPB 500 mg  500 mg Intravenous Q8H Standley Brooking, MD   500 mg at  08/21/15 0900  . morphine 2 MG/ML injection 2 mg  2 mg Intravenous Q2H PRN [name], MD   2 mg at 08/20/15 1701  . ondansetron (ZOFRAN) tablet 4 mg  4 mg Oral Q6H PRN [name], MD       Or  . ondansetron (ZOFRAN) injection 4 mg  4 mg Intravenous Q6H PRN [name], MD   4 mg at 08/14/15 0507  . pantoprazole (PROTONIX) 80 mg in sodium chloride 0.9 % 250 mL (0.32 mg/mL) infusion  8 mg/hr Intravenous Continuous Rolan Lipa, NP 25 mL/hr at 08/21/15 1114 8 mg/hr at 08/21/15 1114  . [START ON 08/24/2015] pantoprazole (PROTONIX) injection 40 mg  40 mg Intravenous Q12H Rolan Lipa, NP      . phenylephrine (NEO-SYNEPHRINE) 40 mg in dextrose 5 % 250 mL (0.16 mg/mL) infusion  0-400 mcg/min Intravenous Titrated Henderson Cloud, MD 56.3 mL/hr at 08/21/15 1136 150 mcg/min at 08/21/15 1136    Allergies as of Sep 03, 2015 - Review Complete 09/03/15  Allergen Reaction Noted  . Penicillins Hives and Rash 08/09/2011    History reviewed. No pertinent family history.  Social History   Social History  . Marital Status: Widowed    Spouse Name: N/A  . Number of Children: N/A  . Years of Education: N/A   Occupational History  . Not on file.   Social History Main Topics  . Smoking status: Never Smoker   . Smokeless tobacco: Not on file  . Alcohol Use: No  . Drug Use: No  . Sexual Activity: Not on file   Other Topics Concern  . Not on file   Social History Narrative    Review of Systems: See HPI, otherwise normal ROS  Physical Exam: Temp:  [97 F (36.1 C)-97.7 F (36.5 C)] 97 F (36.1 C) (01/20 1215) Pulse Rate:  [45-106] 103 (01/20 1600) Resp:  [16-41] 33 (01/20 1600) BP: (77-132)/(36-73) 90/53 mmHg (01/20 1600) SpO2:  [94 %-100 %] 100 % (01/20 1600) Weight:  [156 lb 15.5 oz (71.2 kg)] 156 lb 15.5 oz (71.2 kg) (01/20 0500) Last BM Date: 2015/09/03 Patient is alert and responds appropriately to questions. NG tube is in place with bilious  fluid. Conjunctiva is pink. Sclerae nonicteric. Oropharyngeal mucosa is dry. She is edentulous. Neck masses or thyromegaly noted. Cardiac exam with regular rhythm normal S1 and S2. No murmur or gallop noted. Auscultation of lungs revealed crackles at both bases and anteriorly over right chest. Abdomen is full and symmetrical with midline scar. Bowel sounds are hypoactive. On palpation abdomen is soft with mild generalized tenderness but no guarding or rebound noted. No organomegaly or masses. She has 2+ pitting edema to both upper extremities and trace edema to her ankles and legs.  Intake/Output from previous day: 01/19 0701 - 01/20 0700 In: 9629 [I.V.:2974; NG/GT:150; IV  Piggyback:500] Out: 1600 [Urine:1100; Emesis/NG output:500] Intake/Output this shift: Total I/O In: -  Out: 850 [Urine:850]  Lab Results:  Recent Labs  08/20/15 0445 08/21/15 0409 08/21/15 1104  WBC 37.1* 35.5* 40.1*  HGB 11.2* 9.4* 10.5*  HCT 34.9* 29.2* 32.3*  PLT 164 127* 124*   BMET  Recent Labs  08/20/15 0445 08/21/15 0409 08/21/15 1104  NA 140 128* 137  K 3.6 3.0* 4.4  CL 106 100* 106  CO2 GLUCOSE 113* 408* 92  BUN 36* 28* 33*  CREATININE 1.44* 1.19* 1.26*  CALCIUM 8.0* 6.5* 7.6*   LFT  Recent Labs  08/20/15 0445  PROT 4.0*  ALBUMIN 1.5*  AST 25  ALT 19  ALKPHOS 97  BILITOT 0.6   PT/INR No results for input(s): LABPROT, INR in the last 72 hours. Hepatitis Panel No results for input(s): HEPBSAG, HCVAB, HEPAIGM, HEPBIGM in the last 72 hours.  Studies/Results: Dg Chest Port 1 View  08/21/2015  CLINICAL DATA:  82 year old female with with a left-sided stent placement. EXAM: PORTABLE CHEST 1 VIEW COMPARISON:  Radiograph dated 08/20/2015 FINDINGS: A left-sided PICC is noted with tip over central SVC in stable positioning. Endotracheal tube the tip in the left hemiabdomen. Bilateral pleural effusions and lower lobe airspace opacities again noted. Stable cardiac silhouette.  No pneumothorax. No acute osseous pathology. IMPRESSION: No interval change. Electronically Signed   By: Elgie Collard M.D.   On: 08/21/2015 00:11    Assessment; #1. Upper GI bleed. Upper GI bleed does not appear to be significant. Her hemoglobin on admission was over 14 g but she was quite dehydrated. Hemoglobin was 9.4 this morning and around 10.5 around 11 a.m. today. He is at risk for gastritis esophageal reflux disease as well as peptic ulcer disease. GI bleed could also be result of NG tube trauma in a patient who was on anticoagulant. I do not believe she needs endoscopic evaluation at this time. He will remain on IV pantoprazole until GI tract is functional. #2. Leukocytosis. Her WBC has been gradually going up over the last few days and now over 40 K and this combined with persistent need for vasopressor is concerning for intra-abdominal infection or a leak. C. difficile colitis can also result in impressive leukocytosis but she does not have diarrhea.    Recommendations;  Will continue to monitor H&H. Consider abdominopelvic CT I will leave this up to Dr. Irene Limbo or Dr. Lovell Sheehan. Will do stool sees a by PCR if she has liquid stool.     LOS: 8 days   Gelene Recktenwald U  08/21/2015, 4:21 PM

## 2015-08-21 NOTE — Evaluation (Signed)
Updated daughter Lawson Fiscal at bedside in detail, we discussed critical illness and her tenuous status. Prognosis remains guarded. We discussed elevated white blood cell count in options for further investigation including CT. Have also discussed the case with Dr. Lovell Sheehan. Given the patient's ongoing hypotension and leukocytosis, will continue current treatment and check CT chest abdomen and pelvis to exclude abscess or leak as well as better define lung edema.   Brendia Sacks, MD [facility] [phone]

## 2015-08-21 NOTE — Progress Notes (Signed)
PROGRESS NOTE  Priscilla Wu ZOX:[id number] DOB: 11-15-1925 DOA: August 21, 2015 PCP: Alice Reichert, MD  Summary: 64 yof with a hx of HTN presented with nausea and vomiting. Abdominal xray and CT scan consistent with SBO and possibly acute pancreatitis. General surgery was consulted who performed an ex lap with partial bowel resection on 1/16. Patients vomiting has resolved however she is now hypotensive with intermittent episodes of atrial fibrillation.   Assessment/Plan: 1. SBO, s/p ex lap with partial bowel resection on 1/16. General surgery following. 2. Hypotension despite aggressive IVF; without fever. No evidence of sepsis/infeciton. Improved but still on vasopressor. 3. Suspected ABLA, upper GI bleed. Hgb 9.4 today.  4. Low-normal systolic function with wall motion abnormalities. Not currently a candidate for beta blocker. Discussed with cardiology will add digoxin. 5. Hyponatremia, possibly spurious given hyperglycemia which has not been seen this admit. 6. Atrial fibrillation with RVR, remains in sinus rhythm. She is not on any rate-control medications given her hypotension. Poor candidate for anticoagulation. Digoxin as above. 7. AKI, likely ATN secondary to hypotension. Resolved.  8. Leukocytosis, possibly in response to surgery. Slightly better today. CXR unremarkable. LFTs wnl. Will continue to monitor. 9. Questionable acute pancreatitis, seen on abdominal CT. Lipase minimally elevated on admission, now WNL. Asymptomatic. 10. Hypoalbuminemia.    Appears a bit stronger today however remains critically ill, on vasopressor with ongoing hypotension, peripheral edema, elevated WBC. Difficult to manage BP and fluid balance.  Repeat BMP; suspect not patient's sample.  Continue IV abx empirically given leukocytosis.   Continue PPI and trend Hgb. Consult GI for further evaluation. Stop Lovenox.  Low dose Lasix with albumin.  Code Status: DNR confirmed with POA at bedside today DVT  prophylaxis: SCDs Family Communication: No family at bedside. Disposition Plan: Continue to monitor in ICU.   Brendia Sacks, MD  [facility]  Pager [phone] If 7PM-7AM, please contact night-coverage at [email or web address], password [facility] 08/21/2015, 6:32 AM  LOS: 8 days   Consultants:  General Surgery   Procedures:  Ex Lap and partial small bowel resection 1/16  PICC line placed  ECHO Study Conclusions  - Procedure narrative: Transthoracic echocardiography. Image quality was suboptimal. The study was technically difficult, as a result of poor acoustic windows and restricted patient mobility. - Left ventricle: The cavity size was normal. Wall thickness was increased in a pattern of mild LVH. There is inferoseptal hypokinesis to akinesis, consistent with prior infarct. Systolic function was mildly reduced. The estimated ejection fraction was in the range of 45% to 50%. The study is not technically sufficient to allow evaluation of LV diastolic function. - Aortic valve: Structurally normal valve. Trileaflet. Calcified annulus. Transvalvular velocity was within the normal range. There was no stenosis. There was trivial regurgitation. - Aortic root: The aortic root was top normal in size at 3.8 cm. - Left atrium: The atrium was mildly dilated. - Tricuspid valve: There was mild regurgitation. - Pulmonary arteries: PA peak pressure: 31 mm Hg (S) + RAP. - Systemic veins: Not visualized.  Antibiotics:  none  HPI/Subjective: Feels all right. Denies any pain or vomiting. Has some nausea still.   Per nursing staff, patient had bloody discharge from NG tube overnight. No other overnight events noted.   Objective: Filed Vitals:   08/21/15 0530 08/21/15 0545 08/21/15 0600 08/21/15 0612  BP: 102/64   99/58  Pulse: 96 106 98 100  Temp:      TempSrc:      Resp: Height:  Weight:      SpO2: 100% 94% 100% 100%    Intake/Output Summary (Last  24 hours) at 08/21/15 1610 Last data filed at 08/21/15 0600  Gross per 24 hour  Intake 3693.98 ml  Output   1600 ml  Net 2093.98 ml     Filed Weights   08/19/15 0500 08/20/15 0500 08/21/15 0500  Weight: 70.6 kg (155 lb 10.3 oz) 70.3 kg (154 lb 15.7 oz) 71.2 kg (156 lb 15.5 oz)    Exam:    Afebrile, not hypoxic General:  Appears comfortable, calm. Appears better today Eyes: PERRL, normal lids, irises ENT: grossly normal hearing, lips, tongue Cardiovascular: Regular rate and rhythm, no murmur, rub or gallop. Significant bilateral upper extremity edema. Trace pedal edema, 2+ thigh edema.  Telemetry: Sinus rhythm, no arrhythmias  Respiratory: Clear to auscultation bilaterally, no wheezes, rales or rhonchi. Normal respiratory effort. Abdomen: soft, ntnd, incision is well approximated and appears unremarkable. Some abdominal wall and groin edema.  Musculoskeletal: grossly normal tone bilateral upper and lower extremities Psychiatric: grossly normal mood and affect, speech fluent and appropriate Neurologic: grossly non-focal.   New data reviewed:  UOP 1100  Sodium 128, potassium 3.0, BUN 28, Creatinine 1.19; Glucose 408. Spurious?  WBC slightly improved at 35.5, Hgb down to 9.4, Platelets 127  Pertinent data since admission:  WBC 21.7 on admission  Lactic acid 3.8  Abdominal CT consistent with acute pancreatitis and SBO.    Scheduled Meds: . enoxaparin (LOVENOX) injection  30 mg Subcutaneous Q24H  . [START ON 08/22/2015] levofloxacin (LEVAQUIN) IV  500 mg Intravenous Q48H  . metronidazole  500 mg Intravenous Q8H  . [START ON 08/24/2015] pantoprazole (PROTONIX) IV  40 mg Intravenous Q12H   Continuous Infusions: . lactated ringers 1,000 mL (08/20/15 1457)  . pantoprozole (PROTONIX) infusion 8 mg/hr (08/20/15 2254)  . phenylephrine (NEO-SYNEPHRINE) Adult infusion 150 mcg/min (08/21/15 9604)    Principal Problem:   SBO (small bowel obstruction) (HCC) Active Problems:    Hypertension   Nausea and vomiting in adult patient   AKI (acute kidney injury) (HCC)   Atrial fibrillation with RVR (HCC)   Hypotension   Time spent 30 minutes   By signing my name below, I, Burnett Harry attest that this documentation has been prepared under the direction and in the presence of Brendia Sacks, MD Electronically signed: Burnett Harry, Scribe.  08/21/2015 8:51am   I personally performed the services described in this documentation. All medical record entries made by the scribe were at my direction. I have reviewed the chart and agree that the record reflects my personal performance and is accurate and complete. Brendia Sacks, MD

## 2015-08-22 LAB — DIFFERENTIAL
Basophils Absolute: 0.2 10*3/uL — ABNORMAL HIGH (ref 0.0–0.1)
Basophils Relative: 1 %
EOS PCT: 1 %
Eosinophils Absolute: 0.1 10*3/uL (ref 0.0–0.7)
LYMPHS PCT: 3 %
Lymphs Abs: 0.8 10*3/uL (ref 0.7–4.0)
Monocytes Absolute: 1.8 10*3/uL — ABNORMAL HIGH (ref 0.1–1.0)
Monocytes Relative: 6 %
NEUTROS PCT: 90 %
Neutro Abs: 26.3 10*3/uL — ABNORMAL HIGH (ref 1.7–7.7)

## 2015-08-22 LAB — CBC
HCT: 30.3 % — ABNORMAL LOW (ref 36.0–46.0)
Hemoglobin: 9.2 g/dL — ABNORMAL LOW (ref 12.0–15.0)
MCH: 27.2 pg (ref 26.0–34.0)
MCHC: 30.4 g/dL (ref 30.0–36.0)
MCV: 89.6 fL (ref 78.0–100.0)
PLATELETS: 120 10*3/uL — AB (ref 150–400)
RBC: 3.38 MIL/uL — AB (ref 3.87–5.11)
RDW: 15.6 % — ABNORMAL HIGH (ref 11.5–15.5)
WBC: 29.3 10*3/uL — AB (ref 4.0–10.5)

## 2015-08-22 LAB — COMPREHENSIVE METABOLIC PANEL
ALBUMIN: 1.6 g/dL — AB (ref 3.5–5.0)
ALT: 13 U/L — ABNORMAL LOW (ref 14–54)
ANION GAP: 6 (ref 5–15)
AST: 22 U/L (ref 15–41)
Alkaline Phosphatase: 94 U/L (ref 38–126)
BILIRUBIN TOTAL: 0.5 mg/dL (ref 0.3–1.2)
BUN: 30 mg/dL — ABNORMAL HIGH (ref 6–20)
CO2: 26 mmol/L (ref 22–32)
Calcium: 7.5 mg/dL — ABNORMAL LOW (ref 8.9–10.3)
Chloride: 103 mmol/L (ref 101–111)
Creatinine, Ser: 1.18 mg/dL — ABNORMAL HIGH (ref 0.44–1.00)
GFR, EST AFRICAN AMERICAN: 47 mL/min — AB (ref >60)
GFR, EST NON AFRICAN AMERICAN: 40 mL/min — AB (ref >60)
GLUCOSE: 140 mg/dL — AB (ref 65–99)
POTASSIUM: 3.1 mmol/L — AB (ref 3.5–5.1)
Sodium: 135 mmol/L (ref 135–145)
TOTAL PROTEIN: 3.8 g/dL — AB (ref 6.5–8.1)

## 2015-08-22 LAB — GLUCOSE, CAPILLARY
GLUCOSE-CAPILLARY: 114 mg/dL — AB (ref 65–99)
GLUCOSE-CAPILLARY: 161 mg/dL — AB (ref 65–99)
Glucose-Capillary: 142 mg/dL — ABNORMAL HIGH (ref 65–99)
Glucose-Capillary: 180 mg/dL — ABNORMAL HIGH (ref 65–99)
Glucose-Capillary: 182 mg/dL — ABNORMAL HIGH (ref 65–99)
Glucose-Capillary: 129 mg/dL — ABNORMAL HIGH (ref 65–99)

## 2015-08-22 LAB — MAGNESIUM: MAGNESIUM: 1.3 mg/dL — AB (ref 1.7–2.4)

## 2015-08-22 LAB — TSH: TSH: 2.361 u[IU]/mL (ref 0.350–4.500)

## 2015-08-22 LAB — TRIGLYCERIDES: TRIGLYCERIDES: 49 mg/dL (ref <150)

## 2015-08-22 LAB — CORTISOL: Cortisol, Plasma: 26.1 ug/dL

## 2015-08-22 LAB — PHOSPHORUS: PHOSPHORUS: 2.3 mg/dL — AB (ref 2.5–4.6)

## 2015-08-22 LAB — PREALBUMIN: PREALBUMIN: 2.1 mg/dL — AB (ref 18–38)

## 2015-08-22 MED ORDER — POTASSIUM CHLORIDE 10 MEQ/100ML IV SOLN
10.0000 meq | INTRAVENOUS | Status: AC
  Administered 2015-08-22 (×4): 10 meq via INTRAVENOUS
  Filled 2015-08-22 (×4): qty 100

## 2015-08-22 MED ORDER — TRACE MINERALS CR-CU-MN-SE-ZN 10-1000-500-60 MCG/ML IV SOLN: INTRAVENOUS | Status: DC

## 2015-08-22 MED ORDER — HYDROCORTISONE NA SUCCINATE PF 100 MG IJ SOLR
100.0000 mg | Freq: Four times a day (QID) | INTRAMUSCULAR | Status: DC
  Administered 2015-08-22 – 2015-08-24 (×9): 100 mg via INTRAVENOUS
  Filled 2015-08-22 (×9): qty 2

## 2015-08-22 MED ORDER — FUROSEMIDE 10 MG/ML IJ SOLN
20.0000 mg | Freq: Four times a day (QID) | INTRAMUSCULAR | Status: AC
  Administered 2015-08-22 (×3): 20 mg via INTRAVENOUS
  Filled 2015-08-22 (×3): qty 2

## 2015-08-22 MED ORDER — ALBUMIN HUMAN 25 % IV SOLN
25.0000 g | Freq: Four times a day (QID) | INTRAVENOUS | Status: AC
  Administered 2015-08-22 (×3): 25 g via INTRAVENOUS
  Filled 2015-08-22 (×3): qty 100

## 2015-08-22 MED ORDER — TRACE MINERALS CR-CU-MN-SE-ZN 10-1000-500-60 MCG/ML IV SOLN
INTRAVENOUS | Status: DC
  Administered 2015-08-22: 17:00:00 via INTRAVENOUS
  Filled 2015-08-22: qty 1200

## 2015-08-22 MED ORDER — MAGNESIUM SULFATE 2 GM/50ML IV SOLN
2.0000 g | Freq: Once | INTRAVENOUS | Status: AC
  Administered 2015-08-22: 2 g via INTRAVENOUS
  Filled 2015-08-22: qty 50

## 2015-08-22 NOTE — Progress Notes (Signed)
5 Days Post-Op  Subjective: Patient resting comfortably. Easily arousable.  Objective: Vital signs in last 24 hours: Temp:  [96.9 F (36.1 C)-98.2 F (36.8 C)] 98.2 F (36.8 C) (01/21 0734) Pulse Rate:  [32-132] 94 (01/21 0800) Resp:  [24-43] 34 (01/21 0800) BP: (77-153)/(17-126) 106/60 mmHg (01/21 0800) SpO2:  [78 %-100 %] 100 % (01/21 0800) FiO2 (%):  [40 %] 40 % (01/21 0500) Weight:  [74.7 kg (164 lb 10.9 oz)] 74.7 kg (164 lb 10.9 oz) (01/21 0400) Last BM Date: 2015-09-04  Intake/Output from previous day: 01/20 0701 - 01/21 0700 In: 3040.4 [P.O.:473; I.V.:1752; IV Piggyback:300; TPN:515.3] Out: 2450 [Urine:1850; Emesis/NG output:600] Intake/Output this shift:    General appearance: cooperative and no distress GI: Soft, incision healing well. Bowel sounds appreciated. No rigidity noted. No significant distention noted.  Lab Results:   Recent Labs  08/21/15 1600 08/22/15 0416  WBC 33.5* 29.3*  HGB 9.7* 9.2*  HCT 29.7* 30.3*  PLT 125* 120*   BMET  Recent Labs  08/21/15 1104 08/22/15 0740  NA 137 135  K 4.4 3.1*  CL 106 103  CO2 26 26  GLUCOSE 92 140*  BUN 33* 30*  CREATININE 1.26* 1.18*  CALCIUM 7.6* 7.5*   PT/INR No results for input(s): LABPROT, INR in the last 72 hours.  Studies/Results: Ct Abdomen Pelvis Wo Contrast  08/21/2015  CLINICAL DATA:  Acute onset of nausea and vomiting. Status post exploratory laparotomy with partial bowel resection on January 16th. Hypotension, with intermittent atrial fibrillation. Initial encounter. EXAM: CT CHEST, ABDOMEN AND PELVIS WITHOUT CONTRAST TECHNIQUE: Multidetector CT imaging of the chest, abdomen and pelvis was performed following the standard protocol without IV contrast. COMPARISON:  CT of the abdomen and pelvis from 08/15/2015, and chest radiograph performed 08/20/2015 FINDINGS: CT CHEST There appear to be moderate to large bilateral pleural effusions, with consolidation of the lower lobes bilaterally. The  expanded portions of both lungs demonstrate mild atelectasis and peripheral honeycombing. No pneumothorax is seen. The pleural effusions are significantly enlarged compared to the prior CT. A very large hiatal hernia is again noted, containing the entirety of the stomach, with an enteric tube coiled in the hernia. The hernia is partially filled with air and contrast. Contrast is noted extending along the esophagus, concerning for gastroesophageal reflux and dysmotility. A mildly prominent 1.3 cm aortopulmonary window node is seen. Scattered coronary artery calcifications are noted. Trace pericardial fluid remains within normal limits. Scattered calcification is noted along the aortic arch. The great vessels are grossly unremarkable in appearance. The thyroid gland is unremarkable. No axillary lymphadenopathy is seen. A left PICC is noted ending about the proximal SVC. No acute osseous abnormalities are seen. CT ABDOMEN AND PELVIS A small amount of ascites is noted within the abdomen and pelvis, new from the prior study. Vague soft tissue inflammation is again suggested about the pancreas, though less well characterized on this study without contrast. This is concerning for mild pancreatitis, as previously suggested. Previously noted pseudocysts appear to have largely resolved. Stones are again noted filling the gallbladder. The liver and spleen are grossly unremarkable in appearance. The adrenal glands are unremarkable. Mild to moderate bilateral hydronephrosis is noted, of uncertain significance, with mild prominence of both ureters. This is similar in appearance to the prior study, though the ureters are somewhat more prominent. Nonspecific perinephric stranding is noted bilaterally. No renal or ureteral stones are identified. The small bowel is unremarkable in appearance. The stomach is within normal limits. No acute vascular abnormalities  are seen. Scattered calcification is noted along the abdominal aorta and  its branches. The patient is status post appendectomy. Contrast progresses to the level of the rectum. Scattered diverticulosis is noted along the sigmoid colon, without evidence of diverticulitis. The bladder is relatively decompressed, with a Foley catheter in place. A small amount of air is noted within the bladder, reflecting Foley catheter placement. No inguinal lymphadenopathy is seen. Postoperative change is noted along the anterior abdominal wall. Scattered edema is noted along the lateral abdominal wall bilaterally, and about the pelvis, concerning for mild anasarca. No acute osseous abnormalities are identified. IMPRESSION: 1. Moderate to large bilateral pleural effusions, new from the prior CT, with consolidation of the lower lobes bilaterally. The expanded portions of both lungs demonstrate mild atelectasis and peripheral honeycombing. 2. Small amount of ascites within the abdomen and pelvis, new from the prior study. 3. Very large hiatal hernia again seen, containing the entirety of the stomach, with the enteric tube coiled in the hernia. The hernia is partially filled with barium contrast. Contrast extending along the esophagus is concerning for gastroesophageal reflux and dysmotility. 4. Vague soft tissue inflammation again suggested about the pancreas, again concerning for mild pancreatitis. Previously noted pseudocysts appear to have largely resolved. 5. Mild to moderate bilateral hydronephrosis is similar in appearance to the prior study, though the ureters are somewhat more prominent. No renal or ureteral stone seen. 6. Soft tissue edema along the lateral abdominal wall bilaterally, and about the pelvis, concerning for mild anasarca. 7. Contrast progresses through the bowel to the level of the rectum; no evidence of leak at this time. 8. 1.3 cm mildly prominent aortopulmonary window node seen. 9. Scattered coronary artery calcifications noted. 10. Scattered calcification along the abdominal aorta  and its branches. 11. Scattered diverticulosis along the sigmoid colon, without evidence of diverticulitis. Electronically Signed   By: Roanna Raider M.D.   On: 08/21/2015 23:52   Ct Chest Wo Contrast  08/21/2015  CLINICAL DATA:  Acute onset of nausea and vomiting. Status post exploratory laparotomy with partial bowel resection on January 16th. Hypotension, with intermittent atrial fibrillation. Initial encounter. EXAM: CT CHEST, ABDOMEN AND PELVIS WITHOUT CONTRAST TECHNIQUE: Multidetector CT imaging of the chest, abdomen and pelvis was performed following the standard protocol without IV contrast. COMPARISON:  CT of the abdomen and pelvis from 08/15/2015, and chest radiograph performed 08/20/2015 FINDINGS: CT CHEST There appear to be moderate to large bilateral pleural effusions, with consolidation of the lower lobes bilaterally. The expanded portions of both lungs demonstrate mild atelectasis and peripheral honeycombing. No pneumothorax is seen. The pleural effusions are significantly enlarged compared to the prior CT. A very large hiatal hernia is again noted, containing the entirety of the stomach, with an enteric tube coiled in the hernia. The hernia is partially filled with air and contrast. Contrast is noted extending along the esophagus, concerning for gastroesophageal reflux and dysmotility. A mildly prominent 1.3 cm aortopulmonary window node is seen. Scattered coronary artery calcifications are noted. Trace pericardial fluid remains within normal limits. Scattered calcification is noted along the aortic arch. The great vessels are grossly unremarkable in appearance. The thyroid gland is unremarkable. No axillary lymphadenopathy is seen. A left PICC is noted ending about the proximal SVC. No acute osseous abnormalities are seen. CT ABDOMEN AND PELVIS A small amount of ascites is noted within the abdomen and pelvis, new from the prior study. Vague soft tissue inflammation is again suggested about the  pancreas, though less well characterized on  this study without contrast. This is concerning for mild pancreatitis, as previously suggested. Previously noted pseudocysts appear to have largely resolved. Stones are again noted filling the gallbladder. The liver and spleen are grossly unremarkable in appearance. The adrenal glands are unremarkable. Mild to moderate bilateral hydronephrosis is noted, of uncertain significance, with mild prominence of both ureters. This is similar in appearance to the prior study, though the ureters are somewhat more prominent. Nonspecific perinephric stranding is noted bilaterally. No renal or ureteral stones are identified. The small bowel is unremarkable in appearance. The stomach is within normal limits. No acute vascular abnormalities are seen. Scattered calcification is noted along the abdominal aorta and its branches. The patient is status post appendectomy. Contrast progresses to the level of the rectum. Scattered diverticulosis is noted along the sigmoid colon, without evidence of diverticulitis. The bladder is relatively decompressed, with a Foley catheter in place. A small amount of air is noted within the bladder, reflecting Foley catheter placement. No inguinal lymphadenopathy is seen. Postoperative change is noted along the anterior abdominal wall. Scattered edema is noted along the lateral abdominal wall bilaterally, and about the pelvis, concerning for mild anasarca. No acute osseous abnormalities are identified. IMPRESSION: 1. Moderate to large bilateral pleural effusions, new from the prior CT, with consolidation of the lower lobes bilaterally. The expanded portions of both lungs demonstrate mild atelectasis and peripheral honeycombing. 2. Small amount of ascites within the abdomen and pelvis, new from the prior study. 3. Very large hiatal hernia again seen, containing the entirety of the stomach, with the enteric tube coiled in the hernia. The hernia is partially  filled with barium contrast. Contrast extending along the esophagus is concerning for gastroesophageal reflux and dysmotility. 4. Vague soft tissue inflammation again suggested about the pancreas, again concerning for mild pancreatitis. Previously noted pseudocysts appear to have largely resolved. 5. Mild to moderate bilateral hydronephrosis is similar in appearance to the prior study, though the ureters are somewhat more prominent. No renal or ureteral stone seen. 6. Soft tissue edema along the lateral abdominal wall bilaterally, and about the pelvis, concerning for mild anasarca. 7. Contrast progresses through the bowel to the level of the rectum; no evidence of leak at this time. 8. 1.3 cm mildly prominent aortopulmonary window node seen. 9. Scattered coronary artery calcifications noted. 10. Scattered calcification along the abdominal aorta and its branches. 11. Scattered diverticulosis along the sigmoid colon, without evidence of diverticulitis. Electronically Signed   By: Roanna Raider M.D.   On: 08/21/2015 23:52   Dg Chest Port 1 View  08/21/2015  CLINICAL DATA:  82 year old female with with a left-sided stent placement. EXAM: PORTABLE CHEST 1 VIEW COMPARISON:  Radiograph dated 08/20/2015 FINDINGS: A left-sided PICC is noted with tip over central SVC in stable positioning. Endotracheal tube the tip in the left hemiabdomen. Bilateral pleural effusions and lower lobe airspace opacities again noted. Stable cardiac silhouette. No pneumothorax. No acute osseous pathology. IMPRESSION: No interval change. Electronically Signed   By: Elgie Collard M.D.   On: 08/21/2015 00:11    Anti-infectives: Anti-infectives    Start     Dose/Rate Route Frequency Ordered Stop   08/22/15 1000  levofloxacin (LEVAQUIN) IVPB 500 mg  Status:  Discontinued     500 mg 100 mL/hr over 60 Minutes Intravenous Every 48 hours 08/20/15 1001 08/21/15 1250   08/22/15 1000  levofloxacin (LEVAQUIN) IVPB 750 mg     750 mg 100 mL/hr  over 90 Minutes Intravenous Every 48 hours 08/21/15  1250     08/20/15 1800  metroNIDAZOLE (FLAGYL) IVPB 500 mg     500 mg 100 mL/hr over 60 Minutes Intravenous Every 8 hours 08/20/15 0958     08/20/15 1000  levofloxacin (LEVAQUIN) IVPB 750 mg     750 mg 100 mL/hr over 90 Minutes Intravenous  Once 08/20/15 0937 08/20/15 1140   08/20/15 1000  metroNIDAZOLE (FLAGYL) IVPB 500 mg     500 mg 100 mL/hr over 60 Minutes Intravenous  Once 08/20/15 1610 08/20/15 1347      Assessment/Plan: s/p Procedure(s): EXPLORATORY LAPAROTOMY  PARTIAL SMALL BOWEL RESECTION Impression: CT scan of chest and abdomen remarkable for bilateral consolidation and bilateral pleural effusions. Abdominal series shows contrast through to the rectum with no evidence of anastomotic leak. This is reassuring. We'll remove gastric tube and start on clear liquid diet, though we need to be careful about advancement given her large hiatal hernia and her bedridden state. Agree with continuing TPN. Agree with cortisone and TSH levels, empirically treating with Solu-Cortef due to ongoing hypotension and need for Neo-Synephrine. Discussed findings with family at bedside.  LOS: 9 days    Travarus Trudo A 08/22/2015

## 2015-08-22 NOTE — Progress Notes (Signed)
PARENTERAL NUTRITION CONSULT NOTE  Pharmacy Consult for TPN Indication: SBO, s/p ex lap with partial bowel resection on 1/16  Allergies  Allergen Reactions  . Penicillins Hives and Rash    Has patient had a PCN reaction causing immediate rash, facial/tongue/throat swelling, SOB or lightheadedness with hypotension: Yes Has patient had a PCN reaction causing severe rash involving mucus membranes or skin necrosis: No Has patient had a PCN reaction that required hospitalization No Has patient had a PCN reaction occurring within the last 10 years: No If all of the above answers are "NO", then may proceed with Cephalosporin use.    Patient Measurements: Height:  (167.6 cm) Weight: 164 lb 10.9 oz (74.7 kg) IBW/kg (Calculated) : 59.3 Adjusted Body Weight: 62.3 KG Usual Weight:   Vital Signs: Temp: 98.2 F (36.8 C) (01/21 0734) Temp Source: Axillary (01/21 0734) BP: 106/60 mmHg (01/21 0800) Pulse Rate: 94 (01/21 0800) Intake/Output from previous day: 01/20 0701 - 01/21 0700 In: 3040.4 [P.O.:473; I.V.:1752; IV Piggyback:300; TPN:515.3] Out: 2450 [Urine:1850; Emesis/NG output:600] Intake/Output from this shift:   Labs:  Recent Labs  08/21/15 1104 08/21/15 1600 08/22/15 0416  WBC 40.1* 33.5* 29.3*  HGB 10.5* 9.7* 9.2*  HCT 32.3* 29.7* 30.3*  PLT 124* 125* 120*    Recent Labs  08/20/15 0445 08/21/15 0409 08/21/15 1104 08/22/15 0740  NA 140 128* 137 135  K 3.6 3.0* 4.4 3.1*  CL 106 100* 106 103  CO2 GLUCOSE 113* 408* 92 140*  BUN 36* 28* 33* 30*  CREATININE 1.44* 1.19* 1.26* 1.18*  CALCIUM 8.0* 6.5* 7.6* 7.5*  MG  --   --   --  1.3*  PHOS  --   --   --  2.3*  PROT 4.0*  --   --  3.8*  ALBUMIN 1.5*  --   --  1.6*  AST 25  --   --  22  ALT 19  --   --  13*  ALKPHOS 97  --   --  94  BILITOT 0.6  --   --  0.5  TRIG  --   --   --  49   Estimated Creatinine Clearance: 34.7 mL/min (by C-G formula based on Cr of 1.18).    Recent Labs   08/21/15 2008 08/22/15 0442 08/22/15 0737  GLUCAP 109* 114* 142*    Medical History: Past Medical History  Diagnosis Date  . Hypertension   . UTI (lower urinary tract infection)   . Neck pain   . Headache    Medications:  Scheduled:  . albumin human  25 g Intravenous Q6H  . digoxin  0.125 mg Intravenous Daily  . furosemide  20 mg Intravenous Q6H  . hydrocortisone sod succinate (SOLU-CORTEF) inj  100 mg Intravenous Q6H  . insulin aspart  0-9 Units Subcutaneous 6 times per day  . levofloxacin (LEVAQUIN) IV  750 mg Intravenous Q48H  . magnesium sulfate 1 - 4 g bolus IVPB  2 g Intravenous Once  . metronidazole  500 mg Intravenous Q8H  . [START ON 08/24/2015] pantoprazole (PROTONIX) IV  40 mg Intravenous Q12H  . potassium chloride  10 mEq Intravenous Q1 Hr x 4   Infusions:  . Marland KitchenTPN (CLINIMIX-E) Adult 40 mL/hr at 08/22/15 0630  . Marland KitchenTPN (CLINIMIX-E) Adult    . pantoprozole (PROTONIX) infusion 8 mg/hr (08/22/15 0937)  . phenylephrine (NEO-SYNEPHRINE) Adult infusion 45 mcg/min (08/22/15 0803)   Insulin Requirements in the past 24 hours:  1 unit  Current Nutrition:  NPO   Assessment: Okay for Protocol, Critically ill, on vasopressor with ongoing hypotension, peripheral edema, elevated WBC. Difficult to manage BP and fluid balance.  BMP from this AM repeated, updated results noted.   Ca++ corrects to 9.4 when account for low albumin. K 3.1, Mg 1.3, Phos 2.3, Glucose 140 Per surgery, remove gastric tube, start clear liquid diet, careful about advancement given large hiatal hernia and bedridden state. Continue TPN  Usual Nutritional Goals:  2150 kCal, 110 grams of protein per day (will use 80% of estimated needs due to critical illness until ICU stay > 7 days per protocol)  Plan:  Increase Clinimix E 5/15 to 50 ml/hr  KCL 10 mEq IV x 4 runs to correct K Magnesium Sulfate 2 GM IV x 1 dose to correct Mg Standard Labs No lipids at this time due to critical illness / ICCU  status. SSI   Priscilla Wu 08/22/2015,10:26 AM

## 2015-08-22 NOTE — Progress Notes (Signed)
  Subjective:  Patient denies heartburn nausea vomiting or abdominal pain. She is happy that NG tube was removed and she was able to try clear liquids.  Objective: Blood pressure 106/60, pulse 94, temperature 98.2 F (36.8 C), temperature source Axillary, resp. rate 34, height  (1.676 m), weight 164 lb 10.9 oz (74.7 kg), SpO2 100 %. Patient is alert and responds appropriately to questions. She is tachycapniec. Cardiac exam with regular rhythm normal S1 and S2. No murmur or gallop noted. Breath sounds are absent at both bases. She has some rales over right anterior chest. Abdomen is full. Bowel sounds are hyperactive. On palpation it soft with mild tenderness over midline scar. She has 2+ pitting edema to upper extremities.  Labs/studies Results:   Recent Labs  08/21/15 1104 08/21/15 1600 08/22/15 0416  WBC 40.1* 33.5* 29.3*  HGB 10.5* 9.7* 9.2*  HCT 32.3* 29.7* 30.3*  PLT 124* 125* 120*    BMET   Recent Labs  08/21/15 0409 08/21/15 1104 08/22/15 0740  NA 128* 137 135  K 3.0* 4.4 3.1*  CL 100* 106 103  CO2 GLUCOSE 408* 92 140*  BUN 28* 33* 30*  CREATININE 1.19* 1.26* 1.18*  CALCIUM 6.5* 7.6* 7.5*    LFT   Recent Labs  08/20/15 0445 08/22/15 0740  PROT 4.0* 3.8*  ALBUMIN 1.5* 1.6*  AST 25 22  ALT 19 13*  ALKPHOS 97 94  BILITOT 0.6 0.5    Chest and abdominopelvic CT films reviewed.   Assessment:  #1. Upper GI bleed. NG tube has been removed. No evidence of active bleeding.  She has intrathoracic stomach containing barium. Therefore she needs to be in proper position and as upright as possible at mealtime. #2. Hypotension. Patient remains on phenylephrine and Dr. Irene Limbo has added hydrocortisone just in case. #3. Fluid overload. Most of her fluid is an upper extremities and an pleural spaces. Diuretic therapy per Dr. Irene Limbo. Fluid status should improve once serum albumin comes up. #4. Small bowel obstruction. Status post laparotomy 5 days  ago. No leak noted on abdominopelvic CT and contrast noted to be in the rectum. Or for NG tube has been removed and patient begun on clear liquids by Dr. Lovell Sheehan. #5. Anemia years to be multifactorial. #6. Leukocytosis. WBC is coming down. Blood cultures have remained negative and no diarrhea. #7. Mild thrombocytopenia.   Recommendations:  Keep HOB elevated at all times as discussed with patient's family members and nursing staff.  Continue IV pantoprazole.

## 2015-08-22 NOTE — Progress Notes (Deleted)
CRITICAL VALUE ALERT  Critical value received:  MRSA positive  Date of notification:  08/22/2015  Time of notification:  4:42 PM  Critical value read back:Yes.    Nurse who received alert:  l [email or web address], rn   MD notified (1st page):  Order set entered per protocol  Time of first page:  4:42 PM   MD notified (2nd page):  Time of second page:  Responding MD:  Order set entered Time MD responded: 4:43 PM

## 2015-08-22 NOTE — Progress Notes (Signed)
PROGRESS NOTE  Priscilla Wu:[id number] DOB: [birth year] DOA: 28-Aug-2015 PCP: Alice Reichert, MD  Summary: 62 yof with a hx of HTN presented with nausea and vomiting. Abdominal xray and CT scan consistent with SBO and possibly acute pancreatitis. General surgery was consulted who performed an ex lap with partial bowel resection on 1/16. Patients vomiting has resolved however she is now hypotensive with intermittent episodes of atrial fibrillation.   Assessment/Plan: 1. SBO, s/p ex lap with partial bowel resection on 1/16. General surgery following. No evidence of post op complications by CT 1/20. 2. Hypotension despite aggressive IVF; without fever. No evidence of sepsis/infection. Still on vasopressors. 3. ABLA, secondary to trivial UGIB, anemia of critical illness. Hgb 9.2 stable. Appreciate GI input.  4. Low-normal systolic function with wall motion abnormalities. Not currently a candidate for beta blocker. Discussed with cardiology. Will add digoxin. 5. Hypokalemia and hypomagnesemia, will replete.  6. Bilateral pleural effusions, revealed on CT Chest / AP. Mildly symptomatic. 7. Acute hypoxic respiratory failure. Secondary to bilateral compressive atelectasis, HH, pleural effusions. No evidence of pneumonia. 8. Large HH 9. Atrial fibrillation with RVR, remains in sinus rhythm. Poor candidate for anticoagulation.   10. AKI, likely ATN secondary to hypotension. Resolved.  11. Leukocytosis, improved today. CXR unremarkable. LFTs wnl. CT Chest/AP reveals bilateral pleural effusions compressive atelectasis. Continue empiric abx. No clinical evidence of pancreatitis, and lipase WNL. 12. Hypoalbuminemia.    Remains critically ill, on vasopressor, increased oxygen requirement but no evidence of surgical complication or infection. Discussed in detail with Dr. Lovell Sheehan and with daughter Lawson Fiscal at bedside, reviewed imaging and labs. Good understanding of current challenges. Prognosis guarded, may  not survive hospitalization.  Will continue IV abx, check cortisol, add hydrocortisone.  Give additional Lasix and albumin as BP tolerates  Monitor resp status closely; if poor response to Lasix and worsens will pursue thoracentesis  Wean O2 as tolerated.   Code Status: DNR confirmed with POA at bedside  DVT prophylaxis: SCDs Family Communication: No family at bedside. Disposition Plan: Continue to monitor in ICU.   Brendia Sacks, MD  [facility]  Pager [phone] If 7PM-7AM, please contact night-coverage at [email or web address], password [facility] 08/22/2015, 6:30 AM  LOS: 9 days   Consultants:  General Surgery   GI  Procedures:  Ex Lap and partial small bowel resection 1/16  PICC line placed  ECHO Study Conclusions  - Procedure narrative: Transthoracic echocardiography. Image quality was suboptimal. The study was technically difficult, as a result of poor acoustic windows and restricted patient mobility. - Left ventricle: The cavity size was normal. Wall thickness was increased in a pattern of mild LVH. There is inferoseptal hypokinesis to akinesis, consistent with prior infarct. Systolic function was mildly reduced. The estimated ejection fraction was in the range of 45% to 50%. The study is not technically sufficient to allow evaluation of LV diastolic function. - Aortic valve: Structurally normal valve. Trileaflet. Calcified annulus. Transvalvular velocity was within the normal range. There was no stenosis. There was trivial regurgitation. - Aortic root: The aortic root was top normal in size at 3.8 cm. - Left atrium: The atrium was mildly dilated. - Tricuspid valve: There was mild regurgitation. - Pulmonary arteries: PA peak pressure: 31 mm Hg (S) + RAP. - Systemic veins: Not visualized.  Antibiotics:  none  HPI/Subjective: Oxygen requirement increased overnight; no other change. Patient has some abd soreness, no other complaints. No  SOB.  Objective: Filed Vitals:   08/22/15 0515 08/22/15 0530 08/22/15 0545 08/22/15 0600  BP: 89/55  103/41 101/55  Pulse: 113 110 59 105  Temp:      TempSrc:      Resp: 39 38 30 32  Height:      Weight:      SpO2: 98% 99% 98% 99%    Intake/Output Summary (Last 24 hours) at 08/22/15 0630 Last data filed at 08/22/15 0534  Gross per 24 hour  Intake 2920.08 ml  Output   2450 ml  Net 470.08 ml     Filed Weights   08/20/15 0500 08/21/15 0500 08/22/15 0400  Weight: 70.3 kg (154 lb 15.7 oz) 71.2 kg (156 lb 15.5 oz) 74.7 kg (164 lb 10.9 oz)    Exam:    General:  Appears ill but not toxic Eyes: PERRL, normal lids, irises   ENT: grossly normal hearing, lips & tongue Neck: appears unremarkable Cardiovascular: tachycardic, no m/r/g. No change in upper extremity edema 2-3+. Extremities are well perfused. No change in thigh or abdominal wall edema. Telemetry: ST Respiratory: Inspiratory crackles at right lung. No rhonchi or rales.Markedly increased respiratory effort.  Abdomen: soft, ntnd. Incision appears well-approximated Skin: no rash or induration  Musculoskeletal: grossly normal tone BUE/BLE. Follows commands and moves all extremities Psychiatric: grossly normal mood and affect Neurologic: grossly non-focal.    New data reviewed:  WBC improved at 29.3, Hgb 9.2, Platelets 120  CT chest/ AP noted  Potassium 3.1, BUN 30, Creatinine 1.18  Albumin 1.6  Pertinent data since admission:  WBC 21.7 on admission  Lactic acid 3.8  Abdominal CT consistent with acute pancreatitis and SBO.    Scheduled Meds: . digoxin  0.125 mg Intravenous Daily  . insulin aspart  0-9 Units Subcutaneous 6 times per day  . levofloxacin (LEVAQUIN) IV  750 mg Intravenous Q48H  . metronidazole  500 mg Intravenous Q8H  . [START ON 08/24/2015] pantoprazole (PROTONIX) IV  40 mg Intravenous Q12H   Continuous Infusions: . Marland KitchenTPN (CLINIMIX-E) Adult 40 mL/hr at 08/22/15 0500  . pantoprozole  (PROTONIX) infusion 8 mg/hr (08/22/15 0500)  . phenylephrine (NEO-SYNEPHRINE) Adult infusion 65 mcg/min (08/22/15 0534)    Principal Problem:   SBO (small bowel obstruction) (HCC) Active Problems:   Hypertension   Nausea and vomiting in adult patient   AKI (acute kidney injury) (HCC)   Atrial fibrillation with RVR (HCC)   Hypotension   Time spent 30 minutes   By signing my name below, I, Burnett Harry attest that this documentation has been prepared under the direction and in the presence of Brendia Sacks, MD Electronically signed: Burnett Harry, Scribe.  08/22/2015 8:31am  I personally performed the services described in this documentation. All medical record entries made by the scribe were at my direction. I have reviewed the chart and agree that the record reflects my personal performance and is accurate and complete. Brendia Sacks, MD

## 2015-08-23 LAB — BASIC METABOLIC PANEL
Anion gap: 6 (ref 5–15)
BUN: 32 mg/dL — AB (ref 6–20)
CHLORIDE: 103 mmol/L (ref 101–111)
CO2: 28 mmol/L (ref 22–32)
CREATININE: 1.09 mg/dL — AB (ref 0.44–1.00)
Calcium: 7.8 mg/dL — ABNORMAL LOW (ref 8.9–10.3)
GFR calc Af Amer: 51 mL/min — ABNORMAL LOW (ref >60)
GFR calc non Af Amer: 44 mL/min — ABNORMAL LOW (ref >60)
Glucose, Bld: 137 mg/dL — ABNORMAL HIGH (ref 65–99)
Potassium: 3.1 mmol/L — ABNORMAL LOW (ref 3.5–5.1)
Sodium: 137 mmol/L (ref 135–145)

## 2015-08-23 LAB — GLUCOSE, CAPILLARY
GLUCOSE-CAPILLARY: 188 mg/dL — AB (ref 65–99)
Glucose-Capillary: 115 mg/dL — ABNORMAL HIGH (ref 65–99)
Glucose-Capillary: 141 mg/dL — ABNORMAL HIGH (ref 65–99)
Glucose-Capillary: 164 mg/dL — ABNORMAL HIGH (ref 65–99)

## 2015-08-23 LAB — CBC
HEMATOCRIT: 25.9 % — AB (ref 36.0–46.0)
Hemoglobin: 8.5 g/dL — ABNORMAL LOW (ref 12.0–15.0)
MCH: 27.1 pg (ref 26.0–34.0)
MCHC: 32.8 g/dL (ref 30.0–36.0)
MCV: 82.5 fL (ref 78.0–100.0)
Platelets: 110 10*3/uL — ABNORMAL LOW (ref 150–400)
RBC: 3.14 MIL/uL — AB (ref 3.87–5.11)
RDW: 15 % (ref 11.5–15.5)
WBC: 13.9 10*3/uL — AB (ref 4.0–10.5)

## 2015-08-23 MED ORDER — POTASSIUM CHLORIDE 10 MEQ/100ML IV SOLN
10.0000 meq | INTRAVENOUS | Status: AC
  Administered 2015-08-23 (×3): 10 meq via INTRAVENOUS
  Filled 2015-08-23: qty 100

## 2015-08-23 MED ORDER — BOOST / RESOURCE BREEZE PO LIQD
1.0000 | Freq: Three times a day (TID) | ORAL | Status: DC
  Administered 2015-08-23 – 2015-08-29 (×16): 1 via ORAL

## 2015-08-23 MED ORDER — FUROSEMIDE 10 MG/ML IJ SOLN
20.0000 mg | Freq: Four times a day (QID) | INTRAMUSCULAR | Status: AC
  Administered 2015-08-23 (×3): 20 mg via INTRAVENOUS
  Filled 2015-08-23 (×3): qty 2

## 2015-08-23 MED ORDER — ALBUMIN HUMAN 25 % IV SOLN
25.0000 g | Freq: Once | INTRAVENOUS | Status: AC
  Administered 2015-08-23: 25 g via INTRAVENOUS
  Filled 2015-08-23: qty 100

## 2015-08-23 NOTE — Progress Notes (Signed)
PROGRESS NOTE  Priscilla Wu VOZ:[id number] DOB: 23-Feb-1926 DOA: 08/18/2015 PCP: Alice Reichert, MD  Summary: 32 yof with a hx of HTN presented with nausea and vomiting. Abdominal xray and CT scan consistent with SBO and possibly acute pancreatitis. General surgery was consulted who performed an ex lap with partial bowel resection on 1/16. Patients vomiting has resolved however she is now hypotensive with intermittent episodes of atrial fibrillation.   Assessment/Plan: 1. SBO, s/p ex lap with partial bowel resection on 1/16. No evidence of post op complications by CT 1/20. NGT out, tolerating clear liquids. 2. Hypotension despite aggressive IVF. No evidence of sepsis/infection. Cortisol was adequate. Vasopressors nearly off. 3. ABLA, secondary to trivial UGIB, anemia of critical illness. No evidence of bleeding however Hgb lower at 8.5. Appreciate GI input.  4. Anasarca. Secondary to low BP aggressive volume resuscitation. Improving.  5. Low-normal systolic function with wall motion abnormalities. Not currently a candidate for beta blocker. Discussed with cardiology. Continue digoxin. 6. Bilateral pleural effusions, revealed on CT Chest / AP. Exam improved, no indication for tap at this time.  7. Acute hypoxic respiratory failure. Secondary to bilateral compressive atelectasis, HH, pleural effusions. No evidence of pneumonia. 8. Large hiatal hernia 9. Atrial fibrillation with RVR, remains in sinus rhythm. Poor candidate for anticoagulation.   10. AKI, likely ATN secondary to hypotension. Resolved.  11. Leukocytosis, improved today. CXR unremarkable. LFTs wnl. CT Chest/AP reveals bilateral pleural effusions compressive atelectasis. Continue empiric abx. No clinical evidence of pancreatitis, and lipase WNL. 12. Hypoalbuminemia.    Although critically ill, showing improvement with decreased WBC and nearly off vasopressors, decreased edema, excellent UOP.   Wean pressor off. Concentrate  fluids as able.  Lung exam improved. Will continue with diuresis.   Recheck labs in a.m. Doubt we will need thoracentesis.   Code Status: DNR confirmed with POA at bedside  DVT prophylaxis: SCDs Family Communication: No family at bedside. Disposition Plan: Continue to monitor in ICU.   Brendia Sacks, MD  [facility]  Pager [phone] If 7PM-7AM, please contact night-coverage at [email or web address], password [facility] 08/23/2015, 6:33 AM  LOS: 10 days   Consultants:  General Surgery   GI  Procedures:  Ex Lap and partial small bowel resection 1/16  PICC line placed  ECHO Study Conclusions  - Procedure narrative: Transthoracic echocardiography. Image quality was suboptimal. The study was technically difficult, as a result of poor acoustic windows and restricted patient mobility. - Left ventricle: The cavity size was normal. Wall thickness was increased in a pattern of mild LVH. There is inferoseptal hypokinesis to akinesis, consistent with prior infarct. Systolic function was mildly reduced. The estimated ejection fraction was in the range of 45% to 50%. The study is not technically sufficient to allow evaluation of LV diastolic function. - Aortic valve: Structurally normal valve. Trileaflet. Calcified annulus. Transvalvular velocity was within the normal range. There was no stenosis. There was trivial regurgitation. - Aortic root: The aortic root was top normal in size at 3.8 cm. - Left atrium: The atrium was mildly dilated. - Tricuspid valve: There was mild regurgitation. - Pulmonary arteries: PA peak pressure: 31 mm Hg (S) + RAP. - Systemic veins: Not visualized.  Antibiotics:  none  HPI/Subjective: Feels good. Ate an entire cup of jello. Breathing is doing good and denies any pain.   Objective: Filed Vitals:   08/23/15 0330 08/23/15 0345 08/23/15 0400 08/23/15 0415  BP: 106/53 106/57 106/52 104/53  Pulse: 100 97 105 102  Temp:   97  F (36.1  C)   TempSrc:   Oral   Resp: 30 30 33 30  Height:      Weight:      SpO2: 100% 99% 99% 99%    Intake/Output Summary (Last 24 hours) at 08/23/15 1610 Last data filed at 08/23/15 0500  Gross per 24 hour  Intake 3223.83 ml  Output   5000 ml  Net -1776.17 ml     Filed Weights   08/20/15 0500 08/21/15 0500 08/22/15 0400  Weight: 70.3 kg (154 lb 15.7 oz) 71.2 kg (156 lb 15.5 oz) 74.7 kg (164 lb 10.9 oz)    Exam:    Afebrile General:  Appears comfortable, calm. Appears much improved. Eyes: PERRL, normal lids, irises ENT: grossly normal hearing, lips, tongue Cardiovascular: Tachycardic. No murmur, rub or gallop. Decreased edema on bilateral upper extremities. Unchanged bilateral thigh and abdominal wall edema. 1-2+ pedal edema. Telemetry: Sinus tachycardia, no arrhythmias  Respiratory: Clear to auscultation bilaterally, no wheezes, rales or rhonchi. Normal respiratory effort. Abdomen: soft, ntnd Skin: no rash or induration noted Musculoskeletal: grossly normal tone bilateral upper and lower extremities Psychiatric: grossly normal mood and affect, speech fluent and appropriate Neurologic: grossly non-focal.  New data reviewed:  UOP 5000  WBC improved at 13.9, Hgb 8.5, Platelets 110  Potassium 3.1, BUN 32, Creatinine 1.09  Cortisol wnl  Pertinent data since admission:  WBC 21.7 on admission  Lactic acid 3.8  Abdominal CT consistent with acute pancreatitis and SBO.    Scheduled Meds: . digoxin  0.125 mg Intravenous Daily  . hydrocortisone sod succinate (SOLU-CORTEF) inj  100 mg Intravenous Q6H  . insulin aspart  0-9 Units Subcutaneous 6 times per day  . levofloxacin (LEVAQUIN) IV  750 mg Intravenous Q48H  . metronidazole  500 mg Intravenous Q8H  . [START ON 08/24/2015] pantoprazole (PROTONIX) IV  40 mg Intravenous Q12H   Continuous Infusions: . Marland KitchenTPN (CLINIMIX-E) Adult 50 mL/hr at 08/22/15 1700  . pantoprozole (PROTONIX) infusion 8 mg/hr (08/23/15 0122)  .  phenylephrine (NEO-SYNEPHRINE) Adult infusion 10 mcg/min (08/22/15 2128)    Principal Problem:   SBO (small bowel obstruction) (HCC) Active Problems:   Hypertension   Nausea and vomiting in adult patient   AKI (acute kidney injury) (HCC)   Atrial fibrillation with RVR (HCC)   Hypotension   Time spent 30 minutes   By signing my name below, I, Burnett Harry attest that this documentation has been prepared under the direction and in the presence of Brendia Sacks, MD Electronically signed: Burnett Harry, Scribe.  08/23/2015 8:19am  I personally performed the services described in this documentation. All medical record entries made by the scribe were at my direction. I have reviewed the chart and agree that the record reflects my personal performance and is accurate and complete. Brendia Sacks, MD

## 2015-08-23 NOTE — Progress Notes (Signed)
6 Days Post-Op  Subjective: Patient comfortable. Tolerating clear liquid diet well.  Objective: Vital signs in last 24 hours: Temp:  [97 F (36.1 C)-98.1 F (36.7 C)] 97.3 F (36.3 C) (01/22 0800) Pulse Rate:  [96-125] 114 (01/22 1000) Resp:  [17-38] 27 (01/22 1000) BP: (71-125)/(42-84) 101/56 mmHg (01/22 1000) SpO2:  [98 %-100 %] 99 % (01/22 1000) Last BM Date: 08/23/15  Intake/Output from previous day: 01/21 0701 - 01/22 0700 In: 3223.8 [I.V.:753.8; IV Piggyback:1150; TPN:1320] Out: 5000 [Urine:5000] Intake/Output this shift: Total I/O In: -  Out: 750 [Urine:750]  General appearance: alert, cooperative, appears stated age and no distress GI: Soft, nontender, nondistended. Incision healing well. Bowel sounds appreciated.  Lab Results:   Recent Labs  08/22/15 0416 08/23/15 0430  WBC 29.3* 13.9*  HGB 9.2* 8.5*  HCT 30.3* 25.9*  PLT 120* 110*   BMET  Recent Labs  08/22/15 0740 08/23/15 0430  NA 135 137  K 3.1* 3.1*  CL 103 103  CO2 26 28  GLUCOSE 140* 137*  BUN 30* 32*  CREATININE 1.18* 1.09*  CALCIUM 7.5* 7.8*   PT/INR No results for input(s): LABPROT, INR in the last 72 hours.  Studies/Results: Ct Abdomen Pelvis Wo Contrast  08/21/2015  CLINICAL DATA:  Acute onset of nausea and vomiting. Status post exploratory laparotomy with partial bowel resection on January 16th. Hypotension, with intermittent atrial fibrillation. Initial encounter. EXAM: CT CHEST, ABDOMEN AND PELVIS WITHOUT CONTRAST TECHNIQUE: Multidetector CT imaging of the chest, abdomen and pelvis was performed following the standard protocol without IV contrast. COMPARISON:  CT of the abdomen and pelvis from 08/15/2015, and chest radiograph performed 08/20/2015 FINDINGS: CT CHEST There appear to be moderate to large bilateral pleural effusions, with consolidation of the lower lobes bilaterally. The expanded portions of both lungs demonstrate mild atelectasis and peripheral honeycombing. No  pneumothorax is seen. The pleural effusions are significantly enlarged compared to the prior CT. A very large hiatal hernia is again noted, containing the entirety of the stomach, with an enteric tube coiled in the hernia. The hernia is partially filled with air and contrast. Contrast is noted extending along the esophagus, concerning for gastroesophageal reflux and dysmotility. A mildly prominent 1.3 cm aortopulmonary window node is seen. Scattered coronary artery calcifications are noted. Trace pericardial fluid remains within normal limits. Scattered calcification is noted along the aortic arch. The great vessels are grossly unremarkable in appearance. The thyroid gland is unremarkable. No axillary lymphadenopathy is seen. A left PICC is noted ending about the proximal SVC. No acute osseous abnormalities are seen. CT ABDOMEN AND PELVIS A small amount of ascites is noted within the abdomen and pelvis, new from the prior study. Vague soft tissue inflammation is again suggested about the pancreas, though less well characterized on this study without contrast. This is concerning for mild pancreatitis, as previously suggested. Previously noted pseudocysts appear to have largely resolved. Stones are again noted filling the gallbladder. The liver and spleen are grossly unremarkable in appearance. The adrenal glands are unremarkable. Mild to moderate bilateral hydronephrosis is noted, of uncertain significance, with mild prominence of both ureters. This is similar in appearance to the prior study, though the ureters are somewhat more prominent. Nonspecific perinephric stranding is noted bilaterally. No renal or ureteral stones are identified. The small bowel is unremarkable in appearance. The stomach is within normal limits. No acute vascular abnormalities are seen. Scattered calcification is noted along the abdominal aorta and its branches. The patient is status post appendectomy. Contrast progresses  to the level of the  rectum. Scattered diverticulosis is noted along the sigmoid colon, without evidence of diverticulitis. The bladder is relatively decompressed, with a Foley catheter in place. A small amount of air is noted within the bladder, reflecting Foley catheter placement. No inguinal lymphadenopathy is seen. Postoperative change is noted along the anterior abdominal wall. Scattered edema is noted along the lateral abdominal wall bilaterally, and about the pelvis, concerning for mild anasarca. No acute osseous abnormalities are identified. IMPRESSION: 1. Moderate to large bilateral pleural effusions, new from the prior CT, with consolidation of the lower lobes bilaterally. The expanded portions of both lungs demonstrate mild atelectasis and peripheral honeycombing. 2. Small amount of ascites within the abdomen and pelvis, new from the prior study. 3. Very large hiatal hernia again seen, containing the entirety of the stomach, with the enteric tube coiled in the hernia. The hernia is partially filled with barium contrast. Contrast extending along the esophagus is concerning for gastroesophageal reflux and dysmotility. 4. Vague soft tissue inflammation again suggested about the pancreas, again concerning for mild pancreatitis. Previously noted pseudocysts appear to have largely resolved. 5. Mild to moderate bilateral hydronephrosis is similar in appearance to the prior study, though the ureters are somewhat more prominent. No renal or ureteral stone seen. 6. Soft tissue edema along the lateral abdominal wall bilaterally, and about the pelvis, concerning for mild anasarca. 7. Contrast progresses through the bowel to the level of the rectum; no evidence of leak at this time. 8. 1.3 cm mildly prominent aortopulmonary window node seen. 9. Scattered coronary artery calcifications noted. 10. Scattered calcification along the abdominal aorta and its branches. 11. Scattered diverticulosis along the sigmoid colon, without evidence of  diverticulitis. Electronically Signed   By: Roanna Raider M.D.   On: 08/21/2015 23:52   Ct Chest Wo Contrast  08/21/2015  CLINICAL DATA:  Acute onset of nausea and vomiting. Status post exploratory laparotomy with partial bowel resection on January 16th. Hypotension, with intermittent atrial fibrillation. Initial encounter. EXAM: CT CHEST, ABDOMEN AND PELVIS WITHOUT CONTRAST TECHNIQUE: Multidetector CT imaging of the chest, abdomen and pelvis was performed following the standard protocol without IV contrast. COMPARISON:  CT of the abdomen and pelvis from 08/15/2015, and chest radiograph performed 08/20/2015 FINDINGS: CT CHEST There appear to be moderate to large bilateral pleural effusions, with consolidation of the lower lobes bilaterally. The expanded portions of both lungs demonstrate mild atelectasis and peripheral honeycombing. No pneumothorax is seen. The pleural effusions are significantly enlarged compared to the prior CT. A very large hiatal hernia is again noted, containing the entirety of the stomach, with an enteric tube coiled in the hernia. The hernia is partially filled with air and contrast. Contrast is noted extending along the esophagus, concerning for gastroesophageal reflux and dysmotility. A mildly prominent 1.3 cm aortopulmonary window node is seen. Scattered coronary artery calcifications are noted. Trace pericardial fluid remains within normal limits. Scattered calcification is noted along the aortic arch. The great vessels are grossly unremarkable in appearance. The thyroid gland is unremarkable. No axillary lymphadenopathy is seen. A left PICC is noted ending about the proximal SVC. No acute osseous abnormalities are seen. CT ABDOMEN AND PELVIS A small amount of ascites is noted within the abdomen and pelvis, new from the prior study. Vague soft tissue inflammation is again suggested about the pancreas, though less well characterized on this study without contrast. This is concerning  for mild pancreatitis, as previously suggested. Previously noted pseudocysts appear to have largely resolved.  Stones are again noted filling the gallbladder. The liver and spleen are grossly unremarkable in appearance. The adrenal glands are unremarkable. Mild to moderate bilateral hydronephrosis is noted, of uncertain significance, with mild prominence of both ureters. This is similar in appearance to the prior study, though the ureters are somewhat more prominent. Nonspecific perinephric stranding is noted bilaterally. No renal or ureteral stones are identified. The small bowel is unremarkable in appearance. The stomach is within normal limits. No acute vascular abnormalities are seen. Scattered calcification is noted along the abdominal aorta and its branches. The patient is status post appendectomy. Contrast progresses to the level of the rectum. Scattered diverticulosis is noted along the sigmoid colon, without evidence of diverticulitis. The bladder is relatively decompressed, with a Foley catheter in place. A small amount of air is noted within the bladder, reflecting Foley catheter placement. No inguinal lymphadenopathy is seen. Postoperative change is noted along the anterior abdominal wall. Scattered edema is noted along the lateral abdominal wall bilaterally, and about the pelvis, concerning for mild anasarca. No acute osseous abnormalities are identified. IMPRESSION: 1. Moderate to large bilateral pleural effusions, new from the prior CT, with consolidation of the lower lobes bilaterally. The expanded portions of both lungs demonstrate mild atelectasis and peripheral honeycombing. 2. Small amount of ascites within the abdomen and pelvis, new from the prior study. 3. Very large hiatal hernia again seen, containing the entirety of the stomach, with the enteric tube coiled in the hernia. The hernia is partially filled with barium contrast. Contrast extending along the esophagus is concerning for  gastroesophageal reflux and dysmotility. 4. Vague soft tissue inflammation again suggested about the pancreas, again concerning for mild pancreatitis. Previously noted pseudocysts appear to have largely resolved. 5. Mild to moderate bilateral hydronephrosis is similar in appearance to the prior study, though the ureters are somewhat more prominent. No renal or ureteral stone seen. 6. Soft tissue edema along the lateral abdominal wall bilaterally, and about the pelvis, concerning for mild anasarca. 7. Contrast progresses through the bowel to the level of the rectum; no evidence of leak at this time. 8. 1.3 cm mildly prominent aortopulmonary window node seen. 9. Scattered coronary artery calcifications noted. 10. Scattered calcification along the abdominal aorta and its branches. 11. Scattered diverticulosis along the sigmoid colon, without evidence of diverticulitis. Electronically Signed   By: Roanna Raider M.D.   On: 08/21/2015 23:52    Anti-infectives: Anti-infectives    Start     Dose/Rate Route Frequency Ordered Stop   08/22/15 1000  levofloxacin (LEVAQUIN) IVPB 500 mg  Status:  Discontinued     500 mg 100 mL/hr over 60 Minutes Intravenous Every 48 hours 08/20/15 1001 08/21/15 1250   08/22/15 1000  levofloxacin (LEVAQUIN) IVPB 750 mg     750 mg 100 mL/hr over 90 Minutes Intravenous Every 48 hours 08/21/15 1250     08/20/15 1800  metroNIDAZOLE (FLAGYL) IVPB 500 mg     500 mg 100 mL/hr over 60 Minutes Intravenous Every 8 hours 08/20/15 0958     08/20/15 1000  levofloxacin (LEVAQUIN) IVPB 750 mg     750 mg 100 mL/hr over 90 Minutes Intravenous  Once 08/20/15 0937 08/20/15 1140   08/20/15 1000  metroNIDAZOLE (FLAGYL) IVPB 500 mg     500 mg 100 mL/hr over 60 Minutes Intravenous  Once 08/20/15 8413 08/20/15 1347      Assessment/Plan: s/p Procedure(s): EXPLORATORY LAPAROTOMY  PARTIAL SMALL BOWEL RESECTION Impression: Patient has had a significant turnaround over the  past 24 hours. Her labs  look better. She still has relative hypotension. Her cortisol and TSH levels were unremarkable. She is tolerating clear liquid diet. We'll advance to full liquid diet.  LOS: 10 days    Caleel Kiner A 08/23/2015

## 2015-08-23 NOTE — Progress Notes (Signed)
PARENTERAL NUTRITION CONSULT NOTE  Pharmacy Consult for TPN Indication: SBO, s/p ex lap with partial bowel resection on 1/16  Allergies  Allergen Reactions  . Penicillins Hives and Rash    Has patient had a PCN reaction causing immediate rash, facial/tongue/throat swelling, SOB or lightheadedness with hypotension: Yes Has patient had a PCN reaction causing severe rash involving mucus membranes or skin necrosis: No Has patient had a PCN reaction that required hospitalization No Has patient had a PCN reaction occurring within the last 10 years: No If all of the above answers are "NO", then may proceed with Cephalosporin use.    Patient Measurements: Height:  (167.6 cm) Weight: 164 lb 10.9 oz (74.7 kg) IBW/kg (Calculated) : 59.3 Adjusted Body Weight: 62.3 KG Usual Weight:   Vital Signs: Temp: 97.3 F (36.3 C) (01/22 0800) Temp Source: Oral (01/22 0400) BP: 110/62 mmHg (01/22 1130) Pulse Rate: 113 (01/22 1130) Intake/Output from previous day: 01/21 0701 - 01/22 0700 In: 3223.8 [I.V.:753.8; IV Piggyback:1150; TPN:1320] Out: 5000 [Urine:5000] Intake/Output from this shift: Total I/O In: -  Out: 750 [Urine:750] Labs:  Recent Labs  08/21/15 1600 08/22/15 0416 08/23/15 0430  WBC 33.5* 29.3* 13.9*  HGB 9.7* 9.2* 8.5*  HCT 29.7* 30.3* 25.9*  PLT 125* 120* 110*    Recent Labs  08/21/15 1104 08/22/15 0740 08/23/15 0430  NA 137 135 137  K 4.4 3.1* 3.1*  CL 106 103 103  CO2 GLUCOSE 92 140* 137*  BUN 33* 30* 32*  CREATININE 1.26* 1.18* 1.09*  CALCIUM 7.6* 7.5* 7.8*  MG  --  1.3*  --   PHOS  --  2.3*  --   PROT  --  3.8*  --   ALBUMIN  --  1.6*  --   AST  --  22  --   ALT  --  13*  --   ALKPHOS  --  94  --   BILITOT  --  0.5  --   PREALBUMIN  --  2.1*  --   TRIG  --  49  --    Estimated Creatinine Clearance: 37.6 mL/min (by C-G formula based on Cr of 1.09).    Recent Labs  08/22/15 2314 08/23/15 0356 08/23/15 0757  GLUCAP 129* 115* 141*     Medical History: Past Medical History  Diagnosis Date  . Hypertension   . UTI (lower urinary tract infection)   . Neck pain   . Headache    Medications:  Scheduled:  . digoxin  0.125 mg Intravenous Daily  . feeding supplement  1 Container Oral TID BM  . furosemide  20 mg Intravenous Q6H  . hydrocortisone sod succinate (SOLU-CORTEF) inj  100 mg Intravenous Q6H  . insulin aspart  0-9 Units Subcutaneous 6 times per day  . levofloxacin (LEVAQUIN) IV  750 mg Intravenous Q48H  . metronidazole  500 mg Intravenous Q8H  . [START ON 08/24/2015] pantoprazole (PROTONIX) IV  40 mg Intravenous Q12H   Infusions:  . Marland KitchenTPN (CLINIMIX-E) Adult 50 mL/hr at 08/22/15 1700  . pantoprozole (PROTONIX) infusion 8 mg/hr (08/23/15 1145)  . phenylephrine (NEO-SYNEPHRINE) Adult infusion 10 mcg/min (08/22/15 2128)   Insulin Requirements in the past 24 hours:  4 units  Current Nutrition:  Full liquid diet  Assessment: Okay for Protocol, Critically ill, on vasopressor with ongoing hypotension, peripheral edema, elevated WBC. Difficult to manage BP and fluid balance.  BMP from this AM repeated, updated results noted.   Ca++ corrects to  9.4 when account for low albumin. Per surgery note : Patient has had a significant turnaround over the past 24 hours. Patient tolerating clear liquid diet. Advance to full liquid diet After discussion with Dr. Lovell Sheehan and Dr. Irene Limbo, TPN to be discontinued.  Usual Nutritional Goals:  2150 kCal, 110 grams of protein per day (will use 80% of estimated needs due to critical illness until ICU stay > 7 days per protocol)  Plan:  Decrease TPN rate to 25 ml/hour for 2 hours, then discontinue KCL 10 mEq IV x 3 runs to correct KCL Discontinue SSI started per protocol   Raquel James, Yoel Kaufhold Bennett 08/23/2015,11:48 AM

## 2015-08-23 NOTE — Progress Notes (Signed)
ANTIBIOTIC CONSULT NOTE -Follow up  Pharmacy Consult for Levaquin and Flagyl Indication: intra-abdominal infxn, r/o pna  Allergies  Allergen Reactions  . Penicillins Hives and Rash    Has patient had a PCN reaction causing immediate rash, facial/tongue/throat swelling, SOB or lightheadedness with hypotension: Yes Has patient had a PCN reaction causing severe rash involving mucus membranes or skin necrosis: No Has patient had a PCN reaction that required hospitalization No Has patient had a PCN reaction occurring within the last 10 years: No If all of the above answers are "NO", then may proceed with Cephalosporin use.    Patient Measurements: Height:  (167.6 cm) Weight: 164 lb 10.9 oz (74.7 kg) IBW/kg (Calculated) : 59.3  Vital Signs: Temp: 97.3 F (36.3 C) (01/22 0800) Temp Source: Oral (01/22 0400) BP: 110/62 mmHg (01/22 1130) Pulse Rate: 113 (01/22 1130) Intake/Output from previous day: 01/21 0701 - 01/22 0700 In: 3223.8 [I.V.:753.8; IV Piggyback:1150; TPN:1320] Out: 5000 [Urine:5000] Intake/Output from this shift: Total I/O In: -  Out: 750 [Urine:750]  Labs:  Recent Labs  08/21/15 1104 08/21/15 1600 08/22/15 0416 08/22/15 0740 08/23/15 0430  WBC 40.1* 33.5* 29.3*  --  13.9*  HGB 10.5* 9.7* 9.2*  --  8.5*  PLT 124* 125* 120*  --  110*  CREATININE 1.26*  --   --  1.18* 1.09*   Estimated Creatinine Clearance: 37.6 mL/min (by C-G formula based on Cr of 1.09). No results for input(s): VANCOTROUGH, VANCOPEAK, VANCORANDOM, GENTTROUGH, GENTPEAK, GENTRANDOM, TOBRATROUGH, TOBRAPEAK, TOBRARND, AMIKACINPEAK, AMIKACINTROU, AMIKACIN in the last 72 hours.   Microbiology: Recent Results (from the past 720 hour(s))  Urine culture     Status: None   Collection Time: August 17, 2015 11:28 PM  Result Value Ref Range Status   Specimen Description URINE, CATHETERIZED  Final   Special Requests NONE  Final   Culture   Final    NO GROWTH 1 DAY Performed at [facility]     Report Status 08/14/2015 FINAL  Final  MRSA PCR Screening     Status: None   Collection Time: 08/13/15  3:31 AM  Result Value Ref Range Status   MRSA by PCR NEGATIVE NEGATIVE Final    Comment:        The GeneXpert MRSA Assay (FDA approved for NASAL specimens only), is one component of a comprehensive MRSA colonization surveillance program. It is not intended to diagnose MRSA infection nor to guide or monitor treatment for MRSA infections.   Culture, blood (x 2)     Status: None (Preliminary result)   Collection Time: 08/20/15  9:44 AM  Result Value Ref Range Status   Specimen Description BLOOD  Final   Special Requests NONE  Final   Culture NO GROWTH 3 DAYS  Final   Report Status PENDING  Incomplete  Culture, blood (x 2)     Status: None (Preliminary result)   Collection Time: 08/20/15  9:55 AM  Result Value Ref Range Status   Specimen Description BLOOD  Final   Special Requests NONE  Final   Culture NO GROWTH 3 DAYS  Final   Report Status PENDING  Incomplete   Medical History: Past Medical History  Diagnosis Date  . Hypertension   . UTI (lower urinary tract infection)   . Neck pain   . Headache    Anti-infectives    Start     Dose/Rate Route Frequency Ordered Stop   08/22/15 1000  levofloxacin (LEVAQUIN) IVPB 500 mg  Status:  Discontinued  500 mg 100 mL/hr over 60 Minutes Intravenous Every 48 hours 08/20/15 1001 08/21/15 1250   08/22/15 1000  levofloxacin (LEVAQUIN) IVPB 750 mg     750 mg 100 mL/hr over 90 Minutes Intravenous Every 48 hours 08/21/15 1250     08/20/15 1800  metroNIDAZOLE (FLAGYL) IVPB 500 mg     500 mg 100 mL/hr over 60 Minutes Intravenous Every 8 hours 08/20/15 0958     08/20/15 1000  levofloxacin (LEVAQUIN) IVPB 750 mg     750 mg 100 mL/hr over 90 Minutes Intravenous  Once 08/20/15 0937 08/20/15 1140   08/20/15 1000  metroNIDAZOLE (FLAGYL) IVPB 500 mg     500 mg 100 mL/hr over 60 Minutes Intravenous  Once 08/20/15 1610 08/20/15 1347      Assessment: 82yo female w/ SBO s/p partial bowel resection 1/16, leukocytosis.  Estimated Creatinine Clearance: 37.6 mL/min (by C-G formula based on Cr of 1.09).  Goal of Therapy:  Eradicate infection.  Plan:  Continue Flagyl 500 mg IV q8h (renal adjustment not needed) Continue Levaquin 750 mg IV q48hrs Monitor progress and c/s  Raquel James, Lannis Lichtenwalner Bennett 08/23/2015,12:00 PM

## 2015-08-23 NOTE — Progress Notes (Signed)
  Subjective:  Patient denies heartburn nausea vomiting abdominal pain. She reports no difficulty swallowing liquids. She is now on full liquids. According to nursing staff she had a bowel movement this morning when she passed very small amount of black stool.  Objective: Blood pressure 102/51, pulse 102, temperature 97.5 F (36.4 C), temperature source Oral, resp. rate 25, height  (1.676 m), weight 164 lb 10.9 oz (74.7 kg), SpO2 100 %. Patient is alert and eating her supper with help from her daughter. She remains mildly tachycapneic. She does not appear to have any difficulty swallowing. Cardiac exam with regular rhythm normal S1 and S2. No murmur or gallop noted. Breath sounds clear anteriorly. Breath sounds absent at both bases. Abdomen symmetrical soft and nontender. Upper extremity edema is less than it was yesterday.  Labs/studies Results:   Recent Labs  08/21/15 1600 08/22/15 0416 08/23/15 0430  WBC 33.5* 29.3* 13.9*  HGB 9.7* 9.2* 8.5*  HCT 29.7* 30.3* 25.9*  PLT 125* 120* 110*    BMET   Recent Labs  08/21/15 1104 08/22/15 0740 08/23/15 0430  NA 137 135 137  K 4.4 3.1* 3.1*  CL 106 103 103  CO2 GLUCOSE 92 140* 137*  BUN 33* 30* 32*  CREATININE 1.26* 1.18* 1.09*  CALCIUM 7.6* 7.5* 7.8*     Urine output last 24 hours was 5 L.  Cortisol level 26.1ug/dL is normal. Pre-albumin 2.1.  Assessment:  #1. Upper GI bleed. No evidence of overt GI bleed. H&H is gradually coming down which is multifactorial. Continue to monitor H&H closely. He has known large hiatal hernia. She could've bled due to esophagitis or stress ulceration. #2. Hypotension has resolved. Patient is off vasopressor. #3. Fluid overload. Patient is responded to diuretic therapy. She had 5 L of urine last 24 hours. She has large pleural effusions resulting in tachycapnea. #4. Small bowel obstruction. Status post laparotomy 6 days ago. Ileus has resolved and patient is tolerating full  liquid diet. #5. Anemia years to be multifactorial. #6. Leukocytosis. WBC has dropped from 29K to less than 14K in the last 24 hours. #7. Mild thrombocytopenia. Blood count remains above 100 K. #8. Malnutrition. Serum free albumin is there renal. Patient is on parenteral nutrition and now on oral feeding.  Recommendations:  She will be switched to oral pantoprazole within the next 24 hours. CBC in a.m.

## 2015-08-24 LAB — CBC
HEMATOCRIT: 26.3 % — AB (ref 36.0–46.0)
HEMOGLOBIN: 8.7 g/dL — AB (ref 12.0–15.0)
MCH: 27.3 pg (ref 26.0–34.0)
MCHC: 33.1 g/dL (ref 30.0–36.0)
MCV: 82.4 fL (ref 78.0–100.0)
Platelets: 147 10*3/uL — ABNORMAL LOW (ref 150–400)
RBC: 3.19 MIL/uL — ABNORMAL LOW (ref 3.87–5.11)
RDW: 14.8 % (ref 11.5–15.5)
WBC: 15.4 10*3/uL — ABNORMAL HIGH (ref 4.0–10.5)

## 2015-08-24 LAB — COMPREHENSIVE METABOLIC PANEL
ALBUMIN: 2.5 g/dL — AB (ref 3.5–5.0)
ALT: 11 U/L — AB (ref 14–54)
AST: 19 U/L (ref 15–41)
Alkaline Phosphatase: 77 U/L (ref 38–126)
Anion gap: 10 (ref 5–15)
BUN: 35 mg/dL — AB (ref 6–20)
CHLORIDE: 100 mmol/L — AB (ref 101–111)
CO2: 29 mmol/L (ref 22–32)
CREATININE: 1.07 mg/dL — AB (ref 0.44–1.00)
Calcium: 7.7 mg/dL — ABNORMAL LOW (ref 8.9–10.3)
GFR calc Af Amer: 53 mL/min — ABNORMAL LOW (ref >60)
GFR, EST NON AFRICAN AMERICAN: 45 mL/min — AB (ref >60)
GLUCOSE: 129 mg/dL — AB (ref 65–99)
Potassium: 2.6 mmol/L — CL (ref 3.5–5.1)
Sodium: 139 mmol/L (ref 135–145)
Total Bilirubin: 0.5 mg/dL (ref 0.3–1.2)
Total Protein: 4.3 g/dL — ABNORMAL LOW (ref 6.5–8.1)

## 2015-08-24 LAB — MAGNESIUM: MAGNESIUM: 1.4 mg/dL — AB (ref 1.7–2.4)

## 2015-08-24 LAB — DIFFERENTIAL
Basophils Absolute: 0.1 10*3/uL (ref 0.0–0.1)
Basophils Relative: 1 %
EOS ABS: 0 10*3/uL (ref 0.0–0.7)
EOS PCT: 0 %
LYMPHS ABS: 0.7 10*3/uL (ref 0.7–4.0)
LYMPHS PCT: 4 %
MONO ABS: 1 10*3/uL (ref 0.1–1.0)
MONOS PCT: 6 %
Neutro Abs: 13.7 10*3/uL — ABNORMAL HIGH (ref 1.7–7.7)
Neutrophils Relative %: 89 %

## 2015-08-24 LAB — GLUCOSE, CAPILLARY: Glucose-Capillary: 118 mg/dL — ABNORMAL HIGH (ref 65–99)

## 2015-08-24 LAB — PREALBUMIN: Prealbumin: 6.3 mg/dL — ABNORMAL LOW (ref 18–38)

## 2015-08-24 LAB — PHOSPHORUS: Phosphorus: 2.6 mg/dL (ref 2.5–4.6)

## 2015-08-24 LAB — TRIGLYCERIDES: Triglycerides: 42 mg/dL (ref <150)

## 2015-08-24 MED ORDER — MAGNESIUM SULFATE 2 GM/50ML IV SOLN
2.0000 g | Freq: Once | INTRAVENOUS | Status: AC
  Administered 2015-08-24: 2 g via INTRAVENOUS
  Filled 2015-08-24: qty 50

## 2015-08-24 MED ORDER — POTASSIUM CHLORIDE CRYS ER 20 MEQ PO TBCR
40.0000 meq | EXTENDED_RELEASE_TABLET | ORAL | Status: AC
  Administered 2015-08-24 (×4): 40 meq via ORAL
  Filled 2015-08-24 (×4): qty 2

## 2015-08-24 MED ORDER — HYDROCORTISONE NA SUCCINATE PF 100 MG IJ SOLR
50.0000 mg | Freq: Four times a day (QID) | INTRAMUSCULAR | Status: DC
  Administered 2015-08-24 – 2015-08-26 (×8): 50 mg via INTRAVENOUS
  Filled 2015-08-24 (×8): qty 2

## 2015-08-24 MED ORDER — DIGOXIN 125 MCG PO TABS
0.1250 mg | ORAL_TABLET | Freq: Every day | ORAL | Status: DC
  Administered 2015-08-25 – 2015-08-29 (×5): 0.125 mg via ORAL
  Filled 2015-08-24 (×5): qty 1

## 2015-08-24 MED ORDER — POTASSIUM CHLORIDE CRYS ER 20 MEQ PO TBCR
40.0000 meq | EXTENDED_RELEASE_TABLET | Freq: Once | ORAL | Status: AC
  Administered 2015-08-24: 40 meq via ORAL
  Filled 2015-08-24: qty 2

## 2015-08-24 MED ORDER — PANTOPRAZOLE SODIUM 40 MG PO TBEC
40.0000 mg | DELAYED_RELEASE_TABLET | Freq: Two times a day (BID) | ORAL | Status: DC
  Administered 2015-08-24 – 2015-08-29 (×10): 40 mg via ORAL
  Filled 2015-08-24 (×11): qty 1

## 2015-08-24 MED ORDER — FUROSEMIDE 10 MG/ML IJ SOLN
20.0000 mg | Freq: Four times a day (QID) | INTRAMUSCULAR | Status: AC
  Administered 2015-08-24 (×3): 20 mg via INTRAVENOUS
  Filled 2015-08-24 (×3): qty 2

## 2015-08-24 NOTE — Progress Notes (Signed)
PROGRESS NOTE  Priscilla Wu RUE:[id number] DOB: April 17, 1926 DOA: 08/20/2015 PCP: Alice Reichert, MD  Summary: 57 yof with a hx of HTN presented with nausea and vomiting. Abdominal xray and CT scan consistent with SBO and possibly acute pancreatitis. General surgery was consulted who performed an ex lap with partial bowel resection on 1/16. Patients vomiting has resolved however she is now hypotensive with intermittent episodes of atrial fibrillation.   Assessment/Plan: 1. SBO, s/p ex lap with partial bowel resection on 1/16. Improving. No evidence of post op complications by CT 1/20. Tolerating liquids. 2. Hypotension, resolved. No evidence of sepsis/infection. Cortisol was adequate. Vasopressors discontinued. 3. ABLA, secondary to trivial UGIB, anemia of critical illness. No evidence of bleeding. Appreciate GI input. No further evaluation planned. Follow Hgb and plts. 4. Anasarca. Secondary to aggressive volume resuscitation. Continues to improve with Lasix. 5. Low-normal systolic function with wall motion abnormalities. Not currently a candidate for beta blocker. Discussed with cardiology. Continue digoxin. 6. Bilateral pleural effusions, revealed on CT Chest / AP. Exam improved, no indication for tap at this time. Lungs clear. 7. Acute hypoxic respiratory failure. Improving. Secondary to bilateral compressive atelectasis, HH, pleural effusions. No evidence of pneumonia.  8. Large hiatal hernia  9. Atrial fibrillation with RVR, remains in sinus rhythm/ST. Poor candidate for anticoagulation.   10. AKI, likely ATN secondary to hypotension. Resolved.  11. Leukocytosis, improving. CXR unremarkable. LFTs wnl. CT Chest/AP reveals bilateral pleural effusions compressive atelectasis. No clinical evidence of pancreatitis, and lipase WNL. 12. Hypoalbuminemia. Improved   Looks much better today, showing improvement with decreased WBC and is off vasopressors, decreased edema, excellent UOP. No  fever.  Continue lasix today and monitor UOP, continue abx, decrease steroids and recheck labs in the am.  Code Status: DNR confirmed with POA at bedside  DVT prophylaxis: SCDs Family Communication: Son/POA at bedside. Disposition Plan: Transfer to medical bed.   Brendia Sacks, MD  [facility]  Pager [phone] If 7PM-7AM, please contact night-coverage at [email or web address], password [facility] 08/24/2015, 6:28 AM  LOS: 11 days   Consultants:  General Surgery   GI  Procedures:  Ex Lap and partial small bowel resection 1/16  PICC line placed  ECHO Study Conclusions  - Procedure narrative: Transthoracic echocardiography. Image quality was suboptimal. The study was technically difficult, as a result of poor acoustic windows and restricted patient mobility. - Left ventricle: The cavity size was normal. Wall thickness was increased in a pattern of mild LVH. There is inferoseptal hypokinesis to akinesis, consistent with prior infarct. Systolic function was mildly reduced. The estimated ejection fraction was in the range of 45% to 50%. The study is not technically sufficient to allow evaluation of LV diastolic function. - Aortic valve: Structurally normal valve. Trileaflet. Calcified annulus. Transvalvular velocity was within the normal range. There was no stenosis. There was trivial regurgitation. - Aortic root: The aortic root was top normal in size at 3.8 cm. - Left atrium: The atrium was mildly dilated. - Tricuspid valve: There was mild regurgitation. - Pulmonary arteries: PA peak pressure: 31 mm Hg (S) + RAP. - Systemic veins: Not visualized.  Antibiotics:  none  HPI/Subjective: Son bedside states that progress has been Artist. She is eating, drinking, and is alert. She reports she is feeling better and is doing fine on liquids. Denies any bowel movements. No reports of chest pain, shortness of breath, n/v/d, or abd pain.  Objective: Filed Vitals:    08/24/15 0300 08/24/15 0400 08/24/15 0500 08/24/15 0600  BP: 93/49  88/46 102/56 104/63  Pulse: 91 87 93 91  Temp:      TempSrc:      Resp: Height:      Weight:    72.1 kg (158 lb 15.2 oz)  SpO2: 100% 100% 100% 100%    Intake/Output Summary (Last 24 hours) at 08/24/15 0628 Last data filed at 08/24/15 0500  Gross per 24 hour  Intake 2062.8 ml  Output   4400 ml  Net -2337.2 ml     Filed Weights   08/21/15 0500 08/22/15 0400 08/24/15 0600  Weight: 71.2 kg (156 lb 15.5 oz) 74.7 kg (164 lb 10.9 oz) 72.1 kg (158 lb 15.2 oz)    Exam: Afebrile, VSS, on [place]. General:  Appears comfortable, calm. Eyes: PERRL, normal lids, irises ENT: grossly normal hearing, lips, tongue Cardiovascular: Tachycardiac otherwise normal. Upper extremity edema much improved. Thigh edema and abdominal wall edema without change. 1+ pedal edema. Telemetry: Sinus tachycardia Respiratory: Clear to auscultation bilaterally, no wheezes, rales or rhonchi. Normal respiratory effort. Abdomen: soft, Incision well approximated  Skin: no rash or induration noted Musculoskeletal: grossly normal tone bilateral upper and lower extremities Psychiatric: grossly normal mood and affect, speech fluent and appropriate Neurologic: grossly non-focal.  New data reviewed:  UOP 4400  Potassium 2.6,  Renal function stable.   LFTs unremarkable   Pertinent data since admission:  WBC 21.7 on admission  Lactic acid 3.8  Abdominal CT consistent with acute pancreatitis and SBO.    Scheduled Meds: . digoxin  0.125 mg Intravenous Daily  . feeding supplement  1 Container Oral TID BM  . hydrocortisone sod succinate (SOLU-CORTEF) inj  100 mg Intravenous Q6H  . levofloxacin (LEVAQUIN) IV  750 mg Intravenous Q48H  . metronidazole  500 mg Intravenous Q8H  . pantoprazole (PROTONIX) IV  40 mg Intravenous Q12H   Continuous Infusions: . phenylephrine (NEO-SYNEPHRINE) Adult infusion Stopped (08/23/15 1000)     Principal Problem:   SBO (small bowel obstruction) (HCC) Active Problems:   Hypertension   Nausea and vomiting in adult patient   AKI (acute kidney injury) (HCC)   Atrial fibrillation with RVR (HCC)   Hypotension   Time spent 25 minutes   By signing my name below, I, Zadie Cleverly attest that this documentation has been prepared under the direction and in the presence of Brendia Sacks, MD Electronically signed: Zadie Cleverly  08/24/2015 8:25am  I personally performed the services described in this documentation. All medical record entries made by the scribe were at my direction. I have reviewed the chart and agree that the record reflects my personal performance and is accurate and complete. Brendia Sacks, MD

## 2015-08-24 NOTE — Progress Notes (Signed)
  Subjective:  Patient has no complaints. She is eating her supper with assistance from her son. She denies heartburn nausea vomiting dysphagia or abdominal pain. She had a bowel movement earlier today was use and brown.   Objective: Blood pressure 102/64, pulse 98, temperature 98.1 F (36.7 C), temperature source Oral, resp. rate 30, height  (1.676 m), weight 158 lb 15.2 oz (72.1 kg), SpO2 100 %. Patient is alert and in no acute distress. She remains with mild tachycapnea. Cardiac exam with regular rhythm normal S1 and S2. No murmur or gallop noted. Breath sounds remain absent in both bases. Abdomen abdomen is symmetrical with normal bowel sounds and soft and nontender on palpation. Bilateral upper extremity edema has decreased as evidenced by appearance of skin creases.  Labs/studies Results:   Recent Labs  08/22/15 0416 08/23/15 0430 08/24/15 0430  WBC 29.3* 13.9* 15.4*  HGB 9.2* 8.5* 8.7*  HCT 30.3* 25.9* 26.3*  PLT 120* 110* 147*    BMET   Recent Labs  08/22/15 0740 08/23/15 0430 08/24/15 0430  NA 135 137 139  K 3.1* 3.1* 2.6*  CL 103 103 100*  CO2 GLUCOSE 140* 137* 129*  BUN 30* 32* 35*  CREATININE 1.18* 1.09* 1.07*  CALCIUM 7.5* 7.8* 7.7*    LFT   Recent Labs  08/22/15 0740 08/24/15 0430  PROT 3.8* 4.3*  ALBUMIN 1.6* 2.5*  AST 22 19  ALT 13* 11*  ALKPHOS 94 77  BILITOT 0.5 0.5     Assessment:  #1. Upper GI bleed. Upper GI bleed is inactive. H&H is low but stable. No further workup planned unless bleeding recurs. Given that she has very large hiatal hernia she needs to be aggressively treated for GERD while hospitalized. She can be switched to oral PPI and started has been advanced. #2. Status post laparotomy with resection of segment of small bowel for obstruction 1 week ago and GI function has returned she is tolerating dysphagia 3 diet. #3. Fluid overload. She has been diuresing well with therapy. #4. Malnutrition. Patient was on  TPN while NPO and also received IV albumin. TPN discontinued now that oral intake has improved. #5. Hypokalemia secondary to diuretic therapy. He is receiving KCl by mouth.  Recommendations:  Will switch patient to oral pantoprazole. Will sign off. Please call if you have any questions.

## 2015-08-24 NOTE — Progress Notes (Signed)
7 Days Post-Op  Subjective: Patient looks remarkably well. Is alert.  Objective: Vital signs in last 24 hours: Temp:  [97.2 F (36.2 C)-98.1 F (36.7 C)] 98.1 F (36.7 C) (01/23 0811) Pulse Rate:  [87-124] 100 (01/23 0900) Resp:  [21-39] 30 (01/23 0900) BP: (88-123)/(45-73) 92/45 mmHg (01/23 0900) SpO2:  [96 %-100 %] 96 % (01/23 0900) Weight:  [72.1 kg (158 lb 15.2 oz)] 72.1 kg (158 lb 15.2 oz) (01/23 0600) Last BM Date: 08/23/15  Intake/Output from previous day: 01/22 0701 - 01/23 0700 In: 2062.8 [P.O.:720; I.V.:497.8; IV Piggyback:200; TPN:645] Out: 4400 [Urine:4400] Intake/Output this shift:    General appearance: alert, cooperative, appears stated age and no distress GI: Soft, nontender, nondistended. Incision healing well. Bowel sounds active.  Lab Results:   Recent Labs  08/22/15 0416 08/23/15 0430  WBC 29.3* 13.9*  HGB 9.2* 8.5*  HCT 30.3* 25.9*  PLT 120* 110*   BMET  Recent Labs  08/23/15 0430 08/24/15 0430  NA 137 139  K 3.1* 2.6*  CL 103 100*  CO2 28 29  GLUCOSE 137* 129*  BUN 32* 35*  CREATININE 1.09* 1.07*  CALCIUM 7.8* 7.7*   PT/INR No results for input(s): LABPROT, INR in the last 72 hours.  Studies/Results: No results found.  Anti-infectives: Anti-infectives    Start     Dose/Rate Route Frequency Ordered Stop   08/22/15 1000  levofloxacin (LEVAQUIN) IVPB 500 mg  Status:  Discontinued     500 mg 100 mL/hr over 60 Minutes Intravenous Every 48 hours 08/20/15 1001 08/21/15 1250   08/22/15 1000  levofloxacin (LEVAQUIN) IVPB 750 mg     750 mg 100 mL/hr over 90 Minutes Intravenous Every 48 hours 08/21/15 1250     08/20/15 1800  metroNIDAZOLE (FLAGYL) IVPB 500 mg     500 mg 100 mL/hr over 60 Minutes Intravenous Every 8 hours 08/20/15 0958     08/20/15 1000  levofloxacin (LEVAQUIN) IVPB 750 mg     750 mg 100 mL/hr over 90 Minutes Intravenous  Once 08/20/15 0937 08/20/15 1140   08/20/15 1000  metroNIDAZOLE (FLAGYL) IVPB 500 mg     500  mg 100 mL/hr over 60 Minutes Intravenous  Once 08/20/15 0937 08/20/15 1347      Assessment/Plan: s/p Procedure(s): EXPLORATORY LAPAROTOMY  PARTIAL SMALL BOWEL RESECTION Impression: Patient has made remarkable strides over the last 48 hours. She is tolerating a full liquid diet well. She is having bowel movements. Will advance to mechanical soft diet. We will get patient up to chair.  LOS: 11 days    Arber Wiemers A 08/24/2015

## 2015-08-25 ENCOUNTER — Inpatient Hospital Stay (HOSPITAL_COMMUNITY): Payer: Medicare Other

## 2015-08-25 LAB — BASIC METABOLIC PANEL
ANION GAP: 8 (ref 5–15)
BUN: 35 mg/dL — ABNORMAL HIGH (ref 6–20)
CALCIUM: 7.5 mg/dL — AB (ref 8.9–10.3)
CO2: 31 mmol/L (ref 22–32)
CREATININE: 0.95 mg/dL (ref 0.44–1.00)
Chloride: 100 mmol/L — ABNORMAL LOW (ref 101–111)
GFR calc non Af Amer: 52 mL/min — ABNORMAL LOW (ref >60)
Glucose, Bld: 131 mg/dL — ABNORMAL HIGH (ref 65–99)
Potassium: 3.7 mmol/L (ref 3.5–5.1)
SODIUM: 139 mmol/L (ref 135–145)

## 2015-08-25 LAB — CULTURE, BLOOD (ROUTINE X 2)
CULTURE: NO GROWTH
Culture: NO GROWTH

## 2015-08-25 MED ORDER — ALBUMIN HUMAN 25 % IV SOLN
25.0000 g | Freq: Once | INTRAVENOUS | Status: AC
  Administered 2015-08-25: 25 g via INTRAVENOUS
  Filled 2015-08-25: qty 100

## 2015-08-25 MED ORDER — FUROSEMIDE 10 MG/ML IJ SOLN
40.0000 mg | Freq: Four times a day (QID) | INTRAMUSCULAR | Status: AC
  Administered 2015-08-25 (×3): 40 mg via INTRAVENOUS
  Filled 2015-08-25 (×3): qty 4

## 2015-08-25 NOTE — Evaluation (Signed)
Physical Therapy Evaluation Patient Details Name: Priscilla Wu MRN: [id number] DOB: 02-14-1926 Today's Date: 08/25/2015   History of Present Illness  82yo white female who comes [facility] on 1/12 p N/V. ED imaging reveals SBO and large hiatal hernia. Ex lap c partial bowel resection on 1/16, recovering very well per surgery, however vitals remain outside of normal limits. PMH: HTN, afib c RVR, UTI, neck pain. At baseline pt lives alone, mostly household ambulation s AD, fully indep in ADL, with partial assistance for IADL.   Clinical Impression  Extensive family in room at eval, pt awake, alert, conversational, pleasant, gracious, and courteous. Pt profoundly deconditioned and globally weak. Fully indep in ADL at baseline and HH amb s AD, pt now requires total assist +2 for all bed mob, transfers, and thus unable to ambulate. O2 sats are at 94% on 1L, however HR remains 110's-120's throughout session. RR remains high, but pt abel to self correct after verbal cues. Pt will require a skilled PT intervention and STR placement to address these deficits to restore to PLOF prior to eventual return to home. Recommending DC to SNF and have discussed at length with family/patient who are agreeable.     Follow Up Recommendations SNF    Equipment Recommendations  None recommended by PT    Recommendations for Other Services       Precautions / Restrictions Precautions Precautions: Fall Restrictions Weight Bearing Restrictions: No      Mobility  Bed Mobility Overal bed mobility: +2 for physical assistance;Needs Assistance Bed Mobility: Supine to Sit;Sit to Supine     Supine to sit: Total assist;+2 for physical assistance Sit to supine: Total assist;+2 for physical assistance      Transfers Overall transfer level: Needs assistance Equipment used: 1 person hand held assist (hug assist) Transfers: Sit to/from Stand Sit to Stand: Total assist;+2 physical assistance             Ambulation/Gait                Stairs            Wheelchair Mobility    Modified Rankin (Stroke Patients Only)       Balance                                             Pertinent Vitals/Pain Pain Assessment: No/denies pain    Home Living Family/patient expects to be discharged to:: Private residence Living Arrangements: Alone Available Help at Discharge: Family Type of Home: House Home Access: Stairs to enter Entrance Stairs-Rails: Can reach both Entrance Stairs-Number of Steps: 2 Home Layout: One level Home Equipment: Cane - quad;Grab bars - toilet      Prior Function Level of Independence: Independent with assistive device(s);Needs assistance      ADL's / Homemaking Assistance Needed: needs some assistance with IADL        Hand Dominance   Dominant Hand: Right    Extremity/Trunk Assessment   Upper Extremity Assessment: Generalized weakness           Lower Extremity Assessment: Generalized weakness      Cervical / Trunk Assessment: Kyphotic (trunk weakness. )  Communication   Communication: HOH  Cognition Arousal/Alertness: Awake/alert Behavior During Therapy: WFL for tasks assessed/performed Overall Cognitive Status: Within Functional Limits for tasks assessed  General Comments      Exercises General Exercises - Lower Extremity Long Arc Quad: AAROM;15 reps;Both (RR/HR increases, reamins elevated. )      Assessment/Plan    PT Assessment Patient needs continued PT services  PT Diagnosis Difficulty walking;Generalized weakness   PT Problem List Decreased strength;Decreased range of motion;Decreased activity tolerance;Cardiopulmonary status limiting activity  PT Treatment Interventions DME instruction;Gait training;Functional mobility training;Therapeutic activities;Therapeutic exercise;Patient/family education;Balance training   PT Goals (Current goals can be found in the Care  Plan section) Acute Rehab PT Goals Patient Stated Goal: regain strength and indep.  PT Goal Formulation: With patient/family Time For Goal Achievement: 09/08/15 Potential to Achieve Goals: Fair    Frequency Min 3X/week   Barriers to discharge Inaccessible home environment;Decreased caregiver support      Co-evaluation               End of Session Equipment Utilized During Treatment: Gait belt Activity Tolerance: Patient tolerated treatment well;Patient limited by fatigue Patient left: in bed;with call bell/phone within reach;with family/visitor present Nurse Communication: Mobility status;Other (comment)         Time: 1610-9604 PT Time Calculation (min) (ACUTE ONLY): 30 min   Charges:   PT Evaluation $PT Eval Moderate Complexity: 1 Procedure PT Treatments $Therapeutic Activity: 8-22 mins   PT G Codes:        12:22 PM, 2015/09/11 Rosamaria Lints, PT, DPT PRN Physical Therapist at [facility] License # [id number] [phone] (wireless)  [phone] (mobile)

## 2015-08-25 NOTE — Care Management Note (Signed)
Case Management Note  Patient Details  Name: Priscilla Wu MRN: [id number] Date of Birth: 10/16/1925   Expected Discharge Date:  08/28/2015             Expected Discharge Plan:  Skilled Nursing Facility  In-House Referral:  Clinical Social Work  Discharge planning Services  CM Consult  Post Acute Care Choice:  NA Choice offered to:  NA  DME Arranged:    DME Agency:     HH Arranged:    HH Agency:     Status of Service:  Completed, signed off  Medicare Important Message Given:  Yes Date Medicare IM Given:    Medicare IM give by:    Date Additional Medicare IM Given:    Additional Medicare Important Message give by:     If discussed at Long Length of Stay Meetings, dates discussed:  08/25/2015  Additional Comments: PT has made SNF recommendation. CSW is aware and will work with pt/family to make arrangements for placement at DC. No CM needs at DC anticipated.  Malcolm Metro, RN 08/25/2015, 2:21 PM

## 2015-08-25 NOTE — Progress Notes (Signed)
Patient looks great. Tolerating mechanical soft diet well. Abdominal examination unchanged and looks good. We will follow with you.

## 2015-08-25 NOTE — Progress Notes (Addendum)
PROGRESS NOTE  Priscilla Wu:[id number] DOB: 06/05/1926 DOA: 08/02/2015 PCP: Alice Reichert, MD  Summary: 89 yof with a hx of HTN presented with nausea and vomiting. Abdominal xray and CT scan consistent with SBO and possibly acute pancreatitis. General surgery was consulted who performed an ex lap with partial bowel resection on 1/16. Postoperatively she developed profound hypotension refractory to volume resuscitation and necessitating vasopressor support for several days. Although cortisol was normal, after starting stress test steroids she was quickly weaned off vasopressors. From a surgical standpoint she is doing well and tolerating a diet. Acute hypoxic respiratory failure is improving and is secondary to bilateral compressive atelectasis and pleural effusions. Given her clinical improvement, plan is to continue diuresis and avoid thoracentesis unless symptomatic. She had a significant leukocytosis of unclear etiology, never had fever or systemic signs of infection, she has been treated with empiric antibiotics with significant improvement in leukocytosis. She remains with significant anasarca which is improving with aggressive IV diuresis which she is tolerating very well. She is now mobilizing with physical therapy and making slow improvement. Anticipate discharge to skilled nursing facility in the next 3-4 days.  Assessment/Plan: 1. SBO, s/p ex lap with partial bowel resection on 1/16. No evidence of post op complications by CT 1/20. Tolerating solids. 2. Anasarca. Secondary to aggressive volume resuscitation, low albumin. Excellent diuresis. Will increase lasix. 3. Bilateral pleural effusions, revealed on CT Chest / AP. Exam improved, no indication for tap at this time. Lungs clear. 4. Hypotension, resolved. No evidence of sepsis/infection. Cortisol was adequate but BP improved with steroids. Vasopressors discontinued 1/22.  5. ABLA, secondary to trivial UGIB, anemia of critical  illness. No evidence of bleeding. Appreciate GI input. No further evaluation planned.  6. Low-normal systolic function with wall motion abnormalities. Not currently a candidate for beta blocker. Per cardiology continue digoxin. 7. Acute hypoxic respiratory failure. Improving. Secondary to bilateral compressive atelectasis, HH, pleural effusions. No evidence of pneumonia.  8. Large hiatal hernia  9. Atrial fibrillation with RVR, remains in sinus rhythm/ST. Poor candidate for anticoagulation.   10. AKI, likely ATN secondary to hypotension. Resolved.  11. Leukocytosis, improving. CXR unremarkable. LFTs wnl. CT Chest/AP reveals bilateral pleural effusions compressive atelectasis. No clinical evidence of pancreatitis, and lipase WNL. 12. Hypoalbuminemia. Improved    Overall improving, BP stable off pressors, weaning stress dose steroids; tolerating diet. Fluid balance gradually declining but remains significantly volume overloaded. Still has oxygen requirement and anasarca  PT consult  Repeat CXR today.   Continue abx and increase lasix for more aggressive diuresis. Wean oxygen as tolerated.   Possible transfer to medical bed today.  Code Status: DNR confirmed with POA at bedside  DVT prophylaxis: SCDs Family Communication: Son/POA at bedside. Disposition Plan: Anticipate discharge in 1-2 days.   Brendia Sacks, MD  [facility]  Pager [phone] If 7PM-7AM, please contact night-coverage at [email or web address], password [facility] 08/25/2015, 6:29 AM  LOS: 12 days   Consultants:  General Surgery   GI  Procedures:  Ex Lap and partial small bowel resection 1/16  PICC line placed  ECHO Study Conclusions  - Procedure narrative: Transthoracic echocardiography. Image quality was suboptimal. The study was technically difficult, as a result of poor acoustic windows and restricted patient mobility. - Left ventricle: The cavity size was normal. Wall thickness was increased in a  pattern of mild LVH. There is inferoseptal hypokinesis to akinesis, consistent with prior infarct. Systolic function was mildly reduced. The estimated ejection fraction was in the range of  45% to 50%. The study is not technically sufficient to allow evaluation of LV diastolic function. - Aortic valve: Structurally normal valve. Trileaflet. Calcified annulus. Transvalvular velocity was within the normal range. There was no stenosis. There was trivial regurgitation. - Aortic root: The aortic root was top normal in size at 3.8 cm. - Left atrium: The atrium was mildly dilated. - Tricuspid valve: There was mild regurgitation. - Pulmonary arteries: PA peak pressure: 31 mm Hg (S) + RAP. - Systemic veins: Not visualized.  Antibiotics:  none  HPI/Subjective: Doing well. Was able to sleep, drink liquids, and eat without difficulty (sausage this AM). No reports of chest pain, shortness of breath, n/v/d, or abd pain.  Son reports that she is tolerating movements, food, and sitting in the room chair.   Objective: Filed Vitals:   08/25/15 0300 08/25/15 0400 08/25/15 0500 08/25/15 0600  BP: 93/54 101/52 104/59 98/56  Pulse: 90 99 96 90  Temp:      TempSrc:      Resp: Height:      Weight:      SpO2: 100% 100% 100% 100%    Intake/Output Summary (Last 24 hours) at 08/25/15 0629 Last data filed at 08/25/15 0101  Gross per 24 hour  Intake    600 ml  Output      0 ml  Net    600 ml     Filed Weights   08/21/15 0500 08/22/15 0400 08/24/15 0600  Weight: 71.2 kg (156 lb 15.5 oz) 74.7 kg (164 lb 10.9 oz) 72.1 kg (158 lb 15.2 oz)    Exam: Afebrile, VSS, on [place]. General:  Appears comfortable, calm. Ill but not toxic.Sitting up in bed and is alert.  Eyes: PERRL, normal lids, irises ENT: grossly normal hearing, lips, tongue Neck: No LAD, masses or thyromegaly Cardiovascular: Tachycardiac otherwise normal. Increased right arm and hand edema. Decreased left arm edema.  1+ thigh edema and abdominal wall edema improved. 2+ pedal edema. Telemetry: Sinus tachycardia Respiratory: Clear to auscultation bilaterally, no wheezes, rales or rhonchi. Mild increased respiratory effort. Tachpneic. Abdomen: soft, Incision well approximated  Musculoskeletal: grossly normal tone bilateral upper and lower extremities Psychiatric: grossly normal mood and affect, speech fluent and appropriate Neurologic: grossly non-focal.  New data reviewed:  UOP 1950. +8 liters since admission but PO was not recorded yesterday  Potassium normalized 3.7,  Renal function stable.  Scheduled Meds: . digoxin  0.125 mg Oral Daily  . feeding supplement  1 Container Oral TID BM  . hydrocortisone sod succinate (SOLU-CORTEF) inj  50 mg Intravenous Q6H  . levofloxacin (LEVAQUIN) IV  750 mg Intravenous Q48H  . metronidazole  500 mg Intravenous Q8H  . pantoprazole  40 mg Oral BID AC   Continuous Infusions:    Principal Problem:   SBO (small bowel obstruction) (HCC) Active Problems:   Hypertension   Nausea and vomiting in adult patient   AKI (acute kidney injury) (HCC)   Atrial fibrillation with RVR (HCC)   Hypotension   Time spent 20 minutes   By signing my name below, I, Zadie Cleverly attest that this documentation has been prepared under the direction and in the presence of Brendia Sacks, MD Electronically signed: Zadie Cleverly  08/25/2015   I personally performed the services described in this documentation. All medical record entries made by the scribe were at my direction. I have reviewed the chart and agree that the record reflects my personal performance and is accurate and  complete. Murray Hodgkins, MD

## 2015-08-25 NOTE — Plan of Care (Signed)
Problem: Acute Rehab PT Goals(only PT should resolve) Goal: Pt Will Go Supine/Side To Sit Pt will demonstrate bed mobility scooting up, rolling, and supine to sitting edge-of-bed with minAssist to improve functional strength and to decrease caregiver burden.     Goal: Patient Will Transfer Sit To/From Stand Pt will transfer sit to/from-stand with RW with MinA without loss-of-balance to demonstrate good safety awareness for independent mobility in home.

## 2015-08-26 ENCOUNTER — Inpatient Hospital Stay (HOSPITAL_COMMUNITY): Payer: Medicare Other

## 2015-08-26 LAB — PHOSPHORUS: PHOSPHORUS: 1.8 mg/dL — AB (ref 2.5–4.6)

## 2015-08-26 LAB — MAGNESIUM
MAGNESIUM: 1.3 mg/dL — AB (ref 1.7–2.4)
MAGNESIUM: 2 mg/dL (ref 1.7–2.4)

## 2015-08-26 LAB — CBC
HEMATOCRIT: 28.1 % — AB (ref 36.0–46.0)
HEMOGLOBIN: 9.3 g/dL — AB (ref 12.0–15.0)
MCH: 27.2 pg (ref 26.0–34.0)
MCHC: 33.1 g/dL (ref 30.0–36.0)
MCV: 82.2 fL (ref 78.0–100.0)
Platelets: 165 10*3/uL (ref 150–400)
RBC: 3.42 MIL/uL — ABNORMAL LOW (ref 3.87–5.11)
RDW: 14.9 % (ref 11.5–15.5)
WBC: 21.7 10*3/uL — AB (ref 4.0–10.5)

## 2015-08-26 LAB — BASIC METABOLIC PANEL
ANION GAP: 8 (ref 5–15)
BUN: 34 mg/dL — ABNORMAL HIGH (ref 6–20)
CALCIUM: 7.2 mg/dL — AB (ref 8.9–10.3)
CO2: 35 mmol/L — ABNORMAL HIGH (ref 22–32)
Chloride: 98 mmol/L — ABNORMAL LOW (ref 101–111)
Creatinine, Ser: 0.82 mg/dL (ref 0.44–1.00)
GFR calc Af Amer: >60 mL/min (ref >60)
Glucose, Bld: 132 mg/dL — ABNORMAL HIGH (ref 65–99)
POTASSIUM: 2.4 mmol/L — AB (ref 3.5–5.1)
SODIUM: 141 mmol/L (ref 135–145)

## 2015-08-26 LAB — LACTIC ACID, PLASMA: Lactic Acid, Venous: 3.1 mmol/L (ref 0.5–2.0)

## 2015-08-26 MED ORDER — ALBUMIN HUMAN 25 % IV SOLN
25.0000 g | INTRAVENOUS | Status: AC | PRN
  Administered 2015-08-26 (×2): 25 g via INTRAVENOUS
  Filled 2015-08-26 (×3): qty 100

## 2015-08-26 MED ORDER — METOPROLOL TARTRATE 25 MG PO TABS
12.5000 mg | ORAL_TABLET | Freq: Two times a day (BID) | ORAL | Status: DC
  Administered 2015-08-26: 12.5 mg via ORAL
  Filled 2015-08-26: qty 1

## 2015-08-26 MED ORDER — ALBUMIN HUMAN 25 % IV SOLN
INTRAVENOUS | Status: AC
  Filled 2015-08-26: qty 100

## 2015-08-26 MED ORDER — FUROSEMIDE 10 MG/ML IJ SOLN
40.0000 mg | Freq: Three times a day (TID) | INTRAMUSCULAR | Status: DC
  Administered 2015-08-26 – 2015-08-28 (×5): 40 mg via INTRAVENOUS
  Filled 2015-08-26 (×5): qty 4

## 2015-08-26 MED ORDER — MAGNESIUM SULFATE 2 GM/50ML IV SOLN
2.0000 g | Freq: Once | INTRAVENOUS | Status: AC
  Administered 2015-08-26: 2 g via INTRAVENOUS
  Filled 2015-08-26: qty 50

## 2015-08-26 MED ORDER — POTASSIUM CHLORIDE 10 MEQ/100ML IV SOLN
10.0000 meq | INTRAVENOUS | Status: AC
Start: 2015-08-26 — End: 2015-08-26
  Administered 2015-08-26 (×6): 10 meq via INTRAVENOUS
  Filled 2015-08-26 (×5): qty 100

## 2015-08-26 MED ORDER — POTASSIUM CHLORIDE 10 MEQ/100ML IV SOLN: 10.0000 meq | INTRAVENOUS | Status: DC

## 2015-08-26 MED ORDER — ALBUMIN HUMAN 25 % IV SOLN: 50.0000 g | Freq: Once | INTRAVENOUS | Status: DC

## 2015-08-26 MED ORDER — HYDROCORTISONE NA SUCCINATE PF 100 MG IJ SOLR
50.0000 mg | Freq: Two times a day (BID) | INTRAMUSCULAR | Status: DC
Start: 2015-08-26 — End: 2015-08-29
  Administered 2015-08-26 – 2015-08-29 (×6): 50 mg via INTRAVENOUS
  Filled 2015-08-26 (×6): qty 2

## 2015-08-26 MED ORDER — POTASSIUM CHLORIDE 10 MEQ/100ML IV SOLN
10.0000 meq | INTRAVENOUS | Status: AC
  Administered 2015-08-26 (×4): 10 meq via INTRAVENOUS
  Filled 2015-08-26 (×6): qty 100

## 2015-08-26 MED ORDER — SODIUM CHLORIDE 0.9 % IV BOLUS (SEPSIS)
500.0000 mL | Freq: Once | INTRAVENOUS | Status: AC
  Administered 2015-08-26: 500 mL via INTRAVENOUS

## 2015-08-26 MED ORDER — MAGNESIUM SULFATE 2 GM/50ML IV SOLN
2.0000 g | Freq: Once | INTRAVENOUS | Status: AC
Start: 2015-08-26 — End: 2015-08-26
  Administered 2015-08-26: 2 g via INTRAVENOUS
  Filled 2015-08-26: qty 50

## 2015-08-26 NOTE — Clinical Social Work Placement (Signed)
   CLINICAL SOCIAL WORK PLACEMENT  NOTE  Date:  08/26/2015  Patient Details  Name: Priscilla Wu MRN: [id number] Date of Birth: 1926/02/11  Clinical Social Work is seeking post-discharge placement for this patient at the Skilled  Nursing Facility level of care (*CSW will initial, date and re-position this form in  chart as items are completed):  Yes   Patient/family provided with [facility] Clinical Social Work Department's list of facilities offering this level of care within the geographic area requested by the patient (or if unable, by the patient's family).  Yes   Patient/family informed of their freedom to choose among providers that offer the needed level of care, that participate in Medicare, Medicaid or managed care program needed by the patient, have an available bed and are willing to accept the patient.  Yes   Patient/family informed of [facility]'s ownership interest in [facility] and [facility], as well as of the fact that they are under no obligation to receive care at these facilities.  PASRR submitted to EDS on 08/26/15     PASRR number received on 08/26/15     Existing PASRR number confirmed on       FL2 transmitted to all facilities in geographic area requested by pt/family on 08/26/15     FL2 transmitted to all facilities within larger geographic area on       Patient informed that his/her managed care company has contracts with or will negotiate with certain facilities, including the following:            Patient/family informed of bed offers received.  Patient chooses bed at       Physician recommends and patient chooses bed at      Patient to be transferred to   on  .  Patient to be transferred to facility by       Patient family notified on   of transfer.  Name of family member notified:        PHYSICIAN Please sign FL2     Additional Comment:    _______________________________________________ Liliana Cline,  LCSW 08/26/2015, 2:05 PM

## 2015-08-26 NOTE — Clinical Social Work Note (Signed)
Clinical Social Work Assessment  Patient Details  Name: Priscilla Wu MRN: [id number] Date of Birth: April 09, 1926  Date of referral:  08/26/15               Reason for consult:  Facility Placement                Permission sought to share information with:  Family Supports Permission granted to share information::  Yes, Verbal Permission Granted  Name::     sonTourist information centre manager::     Relationship::  [name] [phone]  Contact Information:     Housing/Transportation Living arrangements for the past 2 months:  [place] of Information:  Adult Children Patient Interpreter Needed:  None Criminal Activity/Legal Involvement Pertinent to Current Situation/Hospitalization:  No - Comment as needed Significant Relationships:  Adult Children, Community Support, Friend Lives with:  Self Do you feel safe going back to the place where you live?  No Need for family participation in patient care:  Yes (Comment)  Care giving concerns:  CSW has met with patient and her sons, Artis and Dominica Severin to discuss SNF- patient residing alone pta. PT recommending SNF-    Social Worker assessment / plan:  CSW provided family with SNF list and they are agreeable to pursuing this for some rehab as she is quite weak from her extended 2 week hospital stay. Patient and family agreeable to plans for SNF search- prefer the [place] area as her daughter lives nearby.   Employment status:  Retired Forensic scientist:  [employer] PT Recommendations:  [place] / Referral to community resources:  [place]  Patient/Family's Response to care:  Family optimistic that patient will rehabilitate and be able to return home-  They are planning to do arrange some private duty care and when she returns home after SNF.   Patient/Family's Understanding of and Emotional Response to Diagnosis, Current Treatment, and Prognosis:  Good,  optimistic  Emotional Assessment Appearance:  Developmentally appropriate Attitude/Demeanor/Rapport:    Affect (typically observed):  Accepting, Appropriate Orientation:  Oriented to Self, Oriented to Place, Oriented to  Time, Oriented to Situation Alcohol / Substance use:  Not Applicable Psych involvement (Current and /or in the community):  No (Comment)  Discharge Needs  Concerns to be addressed:  Discharge Planning Concerns Readmission within the last 30 days:  No Current discharge risk:  None Barriers to Discharge:      Ludwig Clarks, LCSW 08/26/2015, 2:00 PM

## 2015-08-26 NOTE — Progress Notes (Signed)
9 Days Post-Op  Subjective: No complaints. Appears comfortable.  Objective: Vital signs in last 24 hours: Temp:  [96.4 F (35.8 C)-97 F (36.1 C)] 96.9 F (36.1 C) (01/25 0400) Pulse Rate:  [94-128] 99 (01/25 0400) Resp:  [20-39] 27 (01/25 0400) BP: (93-123)/(47-89) 103/58 mmHg (01/25 0400) SpO2:  [94 %-100 %] 95 % (01/25 0400) Weight:  [69.3 kg (152 lb 12.5 oz)] 69.3 kg (152 lb 12.5 oz) (01/25 0500) Last BM Date: 08/24/15  Intake/Output from previous day: 01/24 0701 - 01/25 0700 In: -  Out: 3700 [Urine:3700] Intake/Output this shift:    General appearance: alert, cooperative, appears stated age and no distress GI: soft, non-tender; bowel sounds normal; no masses,  no organomegaly, incision healing well.  Lab Results:   Recent Labs  08/24/15 0430 08/26/15 0440  WBC 15.4* 21.7*  HGB 8.7* 9.3*  HCT 26.3* 28.1*  PLT 147* 165   BMET  Recent Labs  08/25/15 0400 08/26/15 0440  NA 139 141  K 3.7 2.4*  CL 100* 98*  CO2 31 35*  GLUCOSE 131* 132*  BUN 35* 34*  CREATININE 0.95 0.82  CALCIUM 7.5* 7.2*   PT/INR No results for input(s): LABPROT, INR in the last 72 hours.  Studies/Results: Dg Chest Port 1 View  08/25/2015  CLINICAL DATA:  Cough and shortness of Breath EXAM: PORTABLE CHEST 1 VIEW COMPARISON:  08/20/2015 FINDINGS: Cardiac shadow is mildly enlarged but stable. Bilateral lower lobe consolidation with associated effusions are again seen. No new focal infiltrate is seen. No bony abnormality is noted. IMPRESSION: Bibasilar consolidation and effusion stable from the previous exam. Electronically Signed   By: Alcide Clever M.D.   On: 08/25/2015 09:05    Anti-infectives: Anti-infectives    Start     Dose/Rate Route Frequency Ordered Stop   08/22/15 1000  levofloxacin (LEVAQUIN) IVPB 500 mg  Status:  Discontinued     500 mg 100 mL/hr over 60 Minutes Intravenous Every 48 hours 08/20/15 1001 08/21/15 1250   08/22/15 1000  levofloxacin (LEVAQUIN) IVPB 750 mg     750 mg 100 mL/hr over 90 Minutes Intravenous Every 48 hours 08/21/15 1250     08/20/15 1800  metroNIDAZOLE (FLAGYL) IVPB 500 mg     500 mg 100 mL/hr over 60 Minutes Intravenous Every 8 hours 08/20/15 0958     08/20/15 1000  levofloxacin (LEVAQUIN) IVPB 750 mg     750 mg 100 mL/hr over 90 Minutes Intravenous  Once 08/20/15 0937 08/20/15 1140   08/20/15 1000  metroNIDAZOLE (FLAGYL) IVPB 500 mg     500 mg 100 mL/hr over 60 Minutes Intravenous  Once 08/20/15 1610 08/20/15 1347      Assessment/Plan: s/p Procedure(s): EXPLORATORY LAPAROTOMY  PARTIAL SMALL BOWEL RESECTION Impression: Slowly continues to improve. Tolerating mechanical soft diet well. Hypokalemia and hypomagnesemia have been addressed. Continue antibiotics due to leukocytosis secondary to her pulmonary situation. This is secondary to her Lasix.  LOS: 13 days    Priscilla Wu A 08/26/2015

## 2015-08-26 NOTE — Progress Notes (Signed)
[facility] PROGRESS NOTE  TAI SYFERT ZOX:[id number] DOB: 11-29-1925 DOA: 2015/08/24 PCP: Alice Reichert, MD  Assessment/Plan: 1. SBO, s/p ex lap with partial bowel resection on 1/16. No evidence of post op complications by CT 1/20. Tolerating solids. 2. Anasarca. Secondary to aggressive volume resuscitation, low albumin. Currently on IV Lasix with 3.7L UOP yesterday. Right upper extremity is more edematous than left, will [place] to rule out DVT. Continue IV diuresis.  3. Bilateral pleural effusions, revealed on CT Chest / AP. Exam improved, no indication for tap at this time.  Repeat CXR 1/24 stable. Continue IV diuresis.  4. Hypotension, resolved. No evidence of sepsis/infection. Cortisol was adequate but BP improved with steroids. Vasopressors discontinued 1/22. Will start to wean hydrocortisone.  5. Hypokalemia, likely related to Lasix. will replete. Magnesium is also low and will be replaced.  6. ABLA, secondary to trivial UGIB, anemia of critical illness. No evidence of bleeding. Appreciate GI input. No further evaluation planned.  7. Low-normal systolic function with wall motion abnormalities.   Per cardiology continue digoxin. Will start low dose Metoprolol and monitor.  8. Acute hypoxic respiratory failure. Improving. Secondary to bilateral compressive atelectasis, HH, pleural effusions. Continue to wean O2 as tolerated.  9. Large hiatal hernia, continue PPI.  10. Atrial fibrillation with RVR, remains in sinus rhythm/ST. Poor candidate for anticoagulation. Started on low dose BB, will monitor HR.   11. AKI, likely ATN secondary to hypotension. Resolved.  12. Leukocytosis. Unclear if this is related to worsening infection. Patient has been afebrile. Is receiving IV steroids and also may have an element of hemoconcentration from Lasix.  LFTs wnl. CT Chest/AP reveals bilateral pleural effusions compressive atelectasis. No clinical evidence of pancreatitis, and lipase WNL. Continue  to follow. 13. Hypoalbuminemia. Improved  Code Status: DNR confirmed with POA at bedside  DVT prophylaxis: SCDs Family Communication: Son/POA at bedside.   Disposition Plan: Continue to monitor in ICU.    Consultants:  General Surgery   GI  Procedures:  Ex Lap and partial small bowel resection 1/16  PICC line placed  ECHO Study Conclusions  - Procedure narrative: Transthoracic echocardiography. Image quality was suboptimal. The study was technically difficult, as a result of poor acoustic windows and restricted patient mobility. - Left ventricle: The cavity size was normal. Wall thickness was increased in a pattern of mild LVH. There is inferoseptal hypokinesis to akinesis, consistent with prior infarct. Systolic function was mildly reduced. The estimated ejection fraction was in the range of 45% to 50%. The study is not technically sufficient to allow evaluation of LV diastolic function. - Aortic valve: Structurally normal valve. Trileaflet. Calcified annulus. Transvalvular velocity was within the normal range. There was no stenosis. There was trivial regurgitation. - Aortic root: The aortic root was top normal in size at 3.8 cm. - Left atrium: The atrium was mildly dilated. - Tricuspid valve: There was mild regurgitation. - Pulmonary arteries: PA peak pressure: 31 mm Hg (S) + RAP. - Systemic veins: Not visualized.  Antibiotics:  Levaquin 1/21>>  Flagyl 1/19>>  HPI/Subjective: Per son, she is not as responsive this morning but her breathing appears to be better. She is eating well. Patient states she feels okay. Denies a cough or pain.   Objective: Filed Vitals:   08/26/15 0200 08/26/15 0400  BP: 93/58 103/58  Pulse: 108 99  Temp:  96.9 F (36.1 C)  Resp: 33 27    Intake/Output Summary (Last 24 hours) at 08/26/15 0710 Last data filed at 08/26/15 0500  Gross per 24 hour  Intake      0 ml  Output   3700 ml  Net  -3700 ml   Filed  Weights   08/24/15 0600 08/25/15 0500 08/26/15 0500  Weight: 72.1 kg (158 lb 15.2 oz) 72 kg (158 lb 11.7 oz) 69.3 kg (152 lb 12.5 oz)    Exam:  General: NAD, looks comfortable Cardiovascular: Tachycardic, S1, S2  Respiratory:  Diminished breath sounds at bases. No wheezing.   Abdomen: soft, non tender, no distention , bowel sounds normal Musculoskeletal: RUE appears markedly edematous but she has overall anasarca.    Data Reviewed: Basic Metabolic Panel:  Recent Labs Lab 08/22/15 0740 08/23/15 0430 08/24/15 0430 08/25/15 0400 08/26/15 0440  NA 135 137 139 139 141  K 3.1* 3.1* 2.6* 3.7 2.4*  CL 103 103 100* 100* 98*  CO2 35*  GLUCOSE 140* 137* 129* 131* 132*  BUN 30* 32* 35* 35* 34*  CREATININE 1.18* 1.09* 1.07* 0.95 0.82  CALCIUM 7.5* 7.8* 7.7* 7.5* 7.2*  MG 1.3*  --  1.4*  --  1.3*  PHOS 2.3*  --  2.6  --   --    Liver Function Tests:  Recent Labs Lab 08/20/15 0445 08/22/15 0740 08/24/15 0430  AST ALT 19 13* 11*  ALKPHOS 97 94 77  BILITOT 0.6 0.5 0.5  PROT 4.0* 3.8* 4.3*  ALBUMIN 1.5* 1.6* 2.5*    Recent Labs Lab 08/20/15 0445  LIPASE 44    CBC:  Recent Labs Lab 08/21/15 1600 08/22/15 0416 08/23/15 0430 08/24/15 0430 08/26/15 0440  WBC 33.5* 29.3* 13.9* 15.4* 21.7*  NEUTROABS  --  26.3*  --  13.7*  --   HGB 9.7* 9.2* 8.5* 8.7* 9.3*  HCT 29.7* 30.3* 25.9* 26.3* 28.1*  MCV 84.1 89.6 82.5 82.4 82.2  PLT 125* 120* 110* 147* 165     CBG:  Recent Labs Lab 08/22/15 2314 08/23/15 0356 08/23/15 0757 08/23/15 1144 08/23/15 1613  GLUCAP 129* 115* 141* 188* 164*    Recent Results (from the past 240 hour(s))  Culture, blood (x 2)     Status: None   Collection Time: 08/20/15  9:44 AM  Result Value Ref Range Status   Specimen Description BLOOD  Final   Special Requests NONE  Final   Culture NO GROWTH 5 DAYS  Final   Report Status 08/25/2015 FINAL  Final  Culture, blood (x 2)     Status: None   Collection Time:  08/20/15  9:55 AM  Result Value Ref Range Status   Specimen Description BLOOD  Final   Special Requests NONE  Final   Culture NO GROWTH 5 DAYS  Final   Report Status 08/25/2015 FINAL  Final     Studies: Dg Chest Port 1 View  08/25/2015  CLINICAL DATA:  Cough and shortness of Breath EXAM: PORTABLE CHEST 1 VIEW COMPARISON:  08/20/2015 FINDINGS: Cardiac shadow is mildly enlarged but stable. Bilateral lower lobe consolidation with associated effusions are again seen. No new focal infiltrate is seen. No bony abnormality is noted. IMPRESSION: Bibasilar consolidation and effusion stable from the previous exam. Electronically Signed   By: Alcide Clever M.D.   On: 08/25/2015 09:05    Scheduled Meds: . digoxin  0.125 mg Oral Daily  . feeding supplement  1 Container Oral TID BM  . hydrocortisone sod succinate (SOLU-CORTEF) inj  50 mg Intravenous Q6H  . levofloxacin (LEVAQUIN) IV  750 mg Intravenous  Q48H  . metronidazole  500 mg Intravenous Q8H  . pantoprazole  40 mg Oral BID AC   Continuous Infusions:   Principal Problem:   SBO (small bowel obstruction) (HCC) Active Problems:   Hypertension   Nausea and vomiting in adult patient   AKI (acute kidney injury) (HCC)   Atrial fibrillation with RVR (HCC)   Hypotension    Time spent: 25 minutes   Jehanzeb Memon. MD [facility] Pager [phone]. If 7PM-7AM, please contact night-coverage at [email or web address], password [facility] 08/26/2015, 7:10 AM  LOS: 13 days     By signing my name below, I, Burnett Harry, attest that this documentation has been prepared under the direction and in the presence of [employer]. MD Electronically Signed: Burnett Harry, Scribe. 08/26/2015 9:09am  I, Dr. Erick Blinks, personally performed the services described in this documentaiton. All medical record entries made by the scribe were at my direction and in my presence. I have reviewed the chart and agree that the record reflects my personal performance  and is accurate and complete  Erick Blinks, MD, 08/26/2015 9:38 AM

## 2015-08-26 NOTE — Progress Notes (Signed)
Physical Therapy Treatment Patient Details Name: Priscilla Wu MRN: [id number] DOB: [birth year] Today's Date: 08/26/2015    History of Present Illness 82yo white female who comes to [facility] on 1/12 p N/V. ED imaging reveals SBO and large hiatal hernia. Ex lap c partial bowel resection on 1/16, recovering very well per surgery, however vitals remain outside of normal limits. PMH: HTN, afib c RVR, UTI, neck pain. At baseline pt lives alone, mostly household ambulation s AD, fully indep in ADL, with partial assistance for IADL.     PT Comments    Pt was found alert and very cooperative.  She had no pain, HR was WNL.  Resp rate was elevated and closely monitored throughout my visit, tending to stay in the 40s.  We initiated very gentle strengthening exercise to all 4 extremities.  Her UEs are very edematous with the left elbow draining serous fluid.  She tolerated exercise well.  It required total assist to transfer supine to sitting and she was unable to come to stance with a walker.  It took total assist of therapist to transfer bed to chair.  She remains in the recliner chair on room air with O2 sat=93%.  She was instructed in deep breathing technique with which she was fairly successful.  Follow Up Recommendations  SNF     Equipment Recommendations  None recommended by PT    Recommendations for Other Services  OT     Precautions / Restrictions Precautions Precautions: Fall Restrictions Weight Bearing Restrictions: No    Mobility  Bed Mobility Overal bed mobility: +2 for physical assistance;Needs Assistance Bed Mobility: Supine to Sit     Supine to sit: Total assist;+2 for physical assistance;HOB elevated        Transfers Overall transfer level: Needs assistance Equipment used: 1 person hand held assist Transfers: Sit to/from Stand Sit to Stand: Total assist;+2 physical assistance         General transfer comment: pt unable to achieve full stance to a walker.Marland KitchenMarland Kitchenor with  therapist assist...unable to assist in transfer  Ambulation/Gait             General Gait Details: unable   Stairs            Wheelchair Mobility    Modified Rankin (Stroke Patients Only)       Balance Overall balance assessment: Needs assistance Sitting-balance support: No upper extremity supported;Feet supported Sitting balance-Leahy Scale: Good         Standing balance comment: unable to test                    Cognition Arousal/Alertness: Awake/alert Behavior During Therapy: WFL for tasks assessed/performed Overall Cognitive Status: Within Functional Limits for tasks assessed                      Exercises General Exercises - Upper Extremity Shoulder Flexion: AAROM;Right;Left;5 reps;Supine Elbow Flexion: AAROM;Right;Left;5 reps;Supine Elbow Extension: AAROM;Right;Left;5 reps;Supine Digit Composite Flexion: AROM;Both;10 reps;Supine Composite Extension: AROM;Both;10 reps;Supine General Exercises - Lower Extremity Ankle Circles/Pumps: AROM;Both;10 reps;Supine Short Arc Quad: AAROM;Both;10 reps;Supine Heel Slides: AAROM;Both;5 reps;Supine Hip ABduction/ADduction: AAROM;Both;5 reps;Supine    General Comments        Pertinent Vitals/Pain Pain Assessment: No/denies pain    Home Living                      Prior Function            PT Goals (current goals  can now be found in the care plan section) Progress towards PT goals: Progressing toward goals    Frequency  Min 3X/week    PT Plan Current plan remains appropriate    Co-evaluation             End of Session Equipment Utilized During Treatment: Gait belt (O2 sat=93% on room air) Activity Tolerance: Patient tolerated treatment well Patient left: in chair;with call bell/phone within reach;with family/visitor present     Time: 1240-1326 PT Time Calculation (min) (ACUTE ONLY): 46 min  Charges:  $Therapeutic Exercise: 23-37 mins $Therapeutic Activity:  8-22 mins                    G CodesKonrad Penta  PT 08/26/2015, 1:37 PM [phone]

## 2015-08-26 NOTE — Progress Notes (Addendum)
RN, Mysti, paged this NP because pt's BP was in the 50-60 range. RR increased. Albumin  x 2 ordered prn BP < 90 and Lactate was checked. LA over 3. One bolus of 500cc NS ordered for over 3 hours.  F/up with RN-pt's BP up to 77 after one Albumin. 2nd Albumin and bolus starting. F/p LA around MN.  KJKG, NP [facility] Addendum: Prior to 1am, called [facility] MD at [facility] and gave report about pt's hypotension, treatment so far, and pending LA. KJKG, NP [facility]

## 2015-08-26 NOTE — Progress Notes (Signed)
ANTIBIOTIC CONSULT NOTE -Follow up  Pharmacy Consult for Levaquin and Flagyl Indication: intra-abdominal infxn, r/o pna  Allergies  Allergen Reactions  . Penicillins Hives and Rash    Has patient had a PCN reaction causing immediate rash, facial/tongue/throat swelling, SOB or lightheadedness with hypotension: Yes Has patient had a PCN reaction causing severe rash involving mucus membranes or skin necrosis: No Has patient had a PCN reaction that required hospitalization No Has patient had a PCN reaction occurring within the last 10 years: No If all of the above answers are "NO", then may proceed with Cephalosporin use.    Patient Measurements: Height:  (167.6 cm) Weight: 152 lb 12.5 oz (69.3 kg) IBW/kg (Calculated) : 59.3  Vital Signs: Temp: 96.9 F (36.1 C) (01/25 0400) Temp Source: Axillary (01/25 0400) BP: 108/59 mmHg (01/25 0800) Pulse Rate: 107 (01/25 0800) Intake/Output from previous day: 01/24 0701 - 01/25 0700 In: -  Out: 3700 [Urine:3700] Intake/Output from this shift: Total I/O In: 240 [P.O.:240] Out: -   Labs:  Recent Labs  08/24/15 0430 08/25/15 0400 08/26/15 0440  WBC 15.4*  --  21.7*  HGB 8.7*  --  9.3*  PLT 147*  --  165  CREATININE 1.07* 0.95 0.82   Estimated Creatinine Clearance: 45.2 mL/min (by C-G formula based on Cr of 0.82). No results for input(s): VANCOTROUGH, VANCOPEAK, VANCORANDOM, GENTTROUGH, GENTPEAK, GENTRANDOM, TOBRATROUGH, TOBRAPEAK, TOBRARND, AMIKACINPEAK, AMIKACINTROU, AMIKACIN in the last 72 hours.   Microbiology: Recent Results (from the past 720 hour(s))  Urine culture     Status: None   Collection Time: 08/07/2015 11:28 PM  Result Value Ref Range Status   Specimen Description URINE, CATHETERIZED  Final   Special Requests NONE  Final   Culture   Final    NO GROWTH 1 DAY Performed at [facility]    Report Status 08/14/2015 FINAL  Final  MRSA PCR Screening     Status: None   Collection Time: 08/13/15  3:31 AM   Result Value Ref Range Status   MRSA by PCR NEGATIVE NEGATIVE Final    Comment:        The GeneXpert MRSA Assay (FDA approved for NASAL specimens only), is one component of a comprehensive MRSA colonization surveillance program. It is not intended to diagnose MRSA infection nor to guide or monitor treatment for MRSA infections.   Culture, blood (x 2)     Status: None   Collection Time: 08/20/15  9:44 AM  Result Value Ref Range Status   Specimen Description BLOOD  Final   Special Requests NONE  Final   Culture NO GROWTH 5 DAYS  Final   Report Status 08/25/2015 FINAL  Final  Culture, blood (x 2)     Status: None   Collection Time: 08/20/15  9:55 AM  Result Value Ref Range Status   Specimen Description BLOOD  Final   Special Requests NONE  Final   Culture NO GROWTH 5 DAYS  Final   Report Status 08/25/2015 FINAL  Final   Medical History: Past Medical History  Diagnosis Date  . Hypertension   . UTI (lower urinary tract infection)   . Neck pain   . Headache    Anti-infectives    Start     Dose/Rate Route Frequency Ordered Stop   08/22/15 1000  levofloxacin (LEVAQUIN) IVPB 500 mg  Status:  Discontinued     500 mg 100 mL/hr over 60 Minutes Intravenous Every 48 hours 08/20/15 1001 08/21/15 1250   08/22/15 1000  levofloxacin (LEVAQUIN) IVPB 750 mg     750 mg 100 mL/hr over 90 Minutes Intravenous Every 48 hours 08/21/15 1250     08/20/15 1800  metroNIDAZOLE (FLAGYL) IVPB 500 mg     500 mg 100 mL/hr over 60 Minutes Intravenous Every 8 hours 08/20/15 0958     08/20/15 1000  levofloxacin (LEVAQUIN) IVPB 750 mg     750 mg 100 mL/hr over 90 Minutes Intravenous  Once 08/20/15 0937 08/20/15 1140   08/20/15 1000  metroNIDAZOLE (FLAGYL) IVPB 500 mg     500 mg 100 mL/hr over 60 Minutes Intravenous  Once 08/20/15 5409 08/20/15 1347     Assessment: 82yo female w/ SBO s/p partial bowel resection 1/16, leukocytosis noted, pt is on steroids. Currently afebrile.  MRSA PCR (-),  cultures (-).  Estimated Creatinine Clearance: 45.2 mL/min (by C-G formula based on Cr of 0.82).  Goal of Therapy:  Eradicate infection.  Plan:  Continue Flagyl 500 mg IV q8h (renal adjustment not needed) Continue Levaquin 750 mg IV q48hrs Duration of therapy per MD - deescalate when improved / appropriate Monitor progress and vital  Valrie Hart A 08/26/2015,11:05 AM

## 2015-08-26 NOTE — NC FL2 (Signed)
[place] MEDICAID FL2 LEVEL OF CARE SCREENING TOOL     IDENTIFICATION  Patient Name: Priscilla Wu Birthdate: 1926-03-25 Sex: female Admission Date (Current Location): 08/16/2015  [facility] and [place] Number:  [employer] and Address:  [facility],  [address], [place] [address]      Provider Number: [id number]  Attending Physician Name and Address:  Erick Blinks, MD  Relative Name and Phone Number:       Current Level of Care: Hospital Recommended Level of Care: Skilled Nursing Facility Prior Approval Number:    Date Approved/Denied:   PASRR Number:    Discharge Plan: SNF    Current Diagnoses: Patient Active Problem List   Diagnosis Date Noted  . Hypotension 08/18/2015  . Atrial fibrillation with RVR (HCC) 08/15/2015  . Nausea and vomiting in adult patient 08/13/2015  . AKI (acute kidney injury) (HCC) 08/13/2015  . SBO (small bowel obstruction) (HCC)   . Nausea and vomiting in adult 06/17/2014  . Hypokalemia 06/17/2014  . PAC (premature atrial contraction) 06/17/2014  . Hypertension   . Essential hypertension   . Hx of falling, presenting hazards to health 09/10/2012  . Bilateral leg weakness 09/10/2012    Orientation RESPIRATION BLADDER Height & Weight    Self, Time, Situation, Place  O2 (1L) Continent  (167.6 cm) 152 lbs.  BEHAVIORAL SYMPTOMS/MOOD NEUROLOGICAL BOWEL NUTRITION STATUS      Continent    AMBULATORY STATUS COMMUNICATION OF NEEDS Skin   Limited Assist Verbally Surgical wounds                       Personal Care Assistance Level of Assistance  Bathing, Dressing Bathing Assistance: Limited assistance   Dressing Assistance: Limited assistance     Functional Limitations Info             SPECIAL CARE FACTORS FREQUENCY  PT (By licensed PT), OT (By licensed OT)                    Contractures      Additional Factors Info  Code Status, Allergies Code Status Info: DNR Allergies Info:  PENICILLINS           Current Medications (08/26/2015):  This is the current hospital active medication list Current Facility-Administered Medications  Medication Dose Route Frequency Provider Last Rate Last Dose  . acetaminophen (TYLENOL) tablet 650 mg  650 mg Oral Q6H PRN [name], MD       Or  . acetaminophen (TYLENOL) suppository 650 mg  650 mg Rectal Q6H PRN [name], MD      . digoxin (LANOXIN) tablet 0.125 mg  0.125 mg Oral Daily Standley Brooking, MD   0.125 mg at 08/26/15 0900  . feeding supplement (BOOST / RESOURCE BREEZE) liquid 1 Container  1 Container Oral TID BM [name], MD   1 Container at 08/26/15 1000  . furosemide (LASIX) injection 40 mg  40 mg Intravenous 3 times per day Erick Blinks, MD   40 mg at 08/26/15 0950  . hydrocortisone sodium succinate (SOLU-CORTEF) 100 MG injection 50 mg  50 mg Intravenous Q12H Erick Blinks, MD      . ipratropium (ATROVENT) nebulizer solution 0.5 mg  0.5 mg Nebulization Q2H PRN [name], MD      . levofloxacin (LEVAQUIN) IVPB 750 mg  750 mg Intravenous Q48H Standley Brooking, MD   750 mg at 08/26/15 0900  . magnesium  sulfate IVPB 2 g 50 mL  2 g Intravenous Once Erick Blinks, MD      . metoprolol tartrate (LOPRESSOR) tablet 12.5 mg  12.5 mg Oral BID Erick Blinks, MD   12.5 mg at 08/26/15 0950  . metroNIDAZOLE (FLAGYL) IVPB 500 mg  500 mg Intravenous Q8H Standley Brooking, MD   500 mg at 08/26/15 0900  . morphine 2 MG/ML injection 2 mg  2 mg Intravenous Q2H PRN [name], MD   2 mg at 08/22/15 0511  . ondansetron (ZOFRAN) tablet 4 mg  4 mg Oral Q6H PRN [name], MD       Or  . ondansetron (ZOFRAN) injection 4 mg  4 mg Intravenous Q6H PRN [name], MD   4 mg at 08/14/15 0507  . pantoprazole (PROTONIX) EC tablet 40 mg  40 mg Oral BID AC Malissa Hippo, MD   40 mg at 08/26/15 0800  . potassium chloride 10 mEq in 100 mL IVPB  10 mEq Intravenous Q1 Hr x 4 [name], MD   10 mEq at 08/26/15 1100  . potassium  chloride 10 mEq in 100 mL IVPB  10 mEq Intravenous Q1 Hr x 6 Erick Blinks, MD         Discharge Medications: Please see discharge summary for a list of discharge medications.  Relevant Imaging Results:  Relevant Lab Results:   Additional Information SSN [id number]  Liliana Cline, LCSW

## 2015-08-27 LAB — COMPREHENSIVE METABOLIC PANEL
ALT: 12 U/L — ABNORMAL LOW (ref 14–54)
ANION GAP: 9 (ref 5–15)
AST: 22 U/L (ref 15–41)
Albumin: 3.1 g/dL — ABNORMAL LOW (ref 3.5–5.0)
Alkaline Phosphatase: 70 U/L (ref 38–126)
BILIRUBIN TOTAL: 0.7 mg/dL (ref 0.3–1.2)
BUN: 39 mg/dL — AB (ref 6–20)
CHLORIDE: 98 mmol/L — AB (ref 101–111)
CO2: 30 mmol/L (ref 22–32)
Calcium: 7.5 mg/dL — ABNORMAL LOW (ref 8.9–10.3)
Creatinine, Ser: 0.97 mg/dL (ref 0.44–1.00)
GFR, EST AFRICAN AMERICAN: 59 mL/min — AB (ref >60)
GFR, EST NON AFRICAN AMERICAN: 51 mL/min — AB (ref >60)
Glucose, Bld: 140 mg/dL — ABNORMAL HIGH (ref 65–99)
POTASSIUM: 3.3 mmol/L — AB (ref 3.5–5.1)
Sodium: 137 mmol/L (ref 135–145)
TOTAL PROTEIN: 4.4 g/dL — AB (ref 6.5–8.1)

## 2015-08-27 LAB — CBC
HEMATOCRIT: 24.8 % — AB (ref 36.0–46.0)
HEMOGLOBIN: 8.2 g/dL — AB (ref 12.0–15.0)
MCH: 26.8 pg (ref 26.0–34.0)
MCHC: 33.1 g/dL (ref 30.0–36.0)
MCV: 81 fL (ref 78.0–100.0)
Platelets: 134 10*3/uL — ABNORMAL LOW (ref 150–400)
RBC: 3.06 MIL/uL — AB (ref 3.87–5.11)
RDW: 14.9 % (ref 11.5–15.5)
WBC: 31.1 10*3/uL — AB (ref 4.0–10.5)

## 2015-08-27 LAB — HEPARIN LEVEL (UNFRACTIONATED): Heparin Unfractionated: <0.1 IU/mL — ABNORMAL LOW (ref 0.30–0.70)

## 2015-08-27 LAB — LACTIC ACID, PLASMA: LACTIC ACID, VENOUS: 2.2 mmol/L — AB (ref 0.5–2.0)

## 2015-08-27 MED ORDER — PHENYLEPHRINE HCL 10 MG/ML IJ SOLN
INTRAMUSCULAR | Status: AC
  Filled 2015-08-27: qty 2

## 2015-08-27 MED ORDER — HEPARIN (PORCINE) IN NACL 100-0.45 UNIT/ML-% IJ SOLN
1200.0000 [IU]/h | INTRAMUSCULAR | Status: DC
  Administered 2015-08-27: 850 [IU]/h via INTRAVENOUS
  Administered 2015-08-28: 1050 [IU]/h via INTRAVENOUS
  Administered 2015-08-29: 1200 [IU]/h via INTRAVENOUS
  Filled 2015-08-27 (×3): qty 250

## 2015-08-27 MED ORDER — PHENYLEPHRINE HCL 10 MG/ML IJ SOLN
INTRAMUSCULAR | Status: AC
  Filled 2015-08-27: qty 4

## 2015-08-27 MED ORDER — PHENYLEPHRINE HCL 10 MG/ML IJ SOLN
0.0000 ug/min | INTRAVENOUS | Status: DC
  Administered 2015-08-27: 20 ug/min via INTRAVENOUS
  Administered 2015-08-27 (×3): 80 ug/min via INTRAVENOUS
  Filled 2015-08-27 (×3): qty 2

## 2015-08-27 MED ORDER — PHENYLEPHRINE HCL 10 MG/ML IJ SOLN
0.0000 ug/min | INTRAMUSCULAR | Status: DC
  Administered 2015-08-27: 80 ug/min via INTRAVENOUS
  Administered 2015-08-28: 110 ug/min via INTRAVENOUS
  Administered 2015-08-28: 100 ug/min via INTRAVENOUS
  Administered 2015-08-28 – 2015-08-29 (×4): 110 ug/min via INTRAVENOUS
  Filled 2015-08-27 (×8): qty 4

## 2015-08-27 NOTE — Care Management Note (Signed)
Case Management Note  Patient Details  Name: Priscilla Wu MRN: [id number] Date of Birth: [birth year]  Expected Discharge Date:  08/15/15               Expected Discharge Plan:  Skilled Nursing Facility  In-House Referral:  Clinical Social Work  Discharge planning Services  CM Consult  Post Acute Care Choice:  NA Choice offered to:  NA  DME Arranged:    DME Agency:     HH Arranged:    HH Agency:     Status of Service:  Completed, signed off  Medicare Important Message Given:  Yes Date Medicare IM Given:    Medicare IM give by:    Date Additional Medicare IM Given:    Additional Medicare Important Message give by:     If discussed at Long Length of Stay Meetings, dates discussed:  08/27/2015  Additional Comments:  Malcolm Metro, RN 08/27/2015, 4:00 PM

## 2015-08-27 NOTE — Progress Notes (Addendum)
ANTICOAGULATION CONSULT NOTE - Initial Consult  Pharmacy Consult for heparin Indication: DVT  Allergies  Allergen Reactions  . Penicillins Hives and Rash    Has patient had a PCN reaction causing immediate rash, facial/tongue/throat swelling, SOB or lightheadedness with hypotension: Yes Has patient had a PCN reaction causing severe rash involving mucus membranes or skin necrosis: No Has patient had a PCN reaction that required hospitalization No Has patient had a PCN reaction occurring within the last 10 years: No If all of the above answers are "NO", then may proceed with Cephalosporin use.     Patient Measurements: Height:  (167.6 cm) Weight: 156 lb 15.5 oz (71.2 kg) IBW/kg (Calculated) : 59.3 Heparin Dosing Weight: 59.4  Vital Signs: Temp: 96.7 F (35.9 C) (01/26 0835) Temp Source: Oral (01/26 0835) BP: 100/54 mmHg (01/26 0800) Pulse Rate: 117 (01/26 1017)  Labs:  Recent Labs  08/25/15 0400 08/26/15 0440 08/27/15 0430 08/27/15 0926  HGB  --  9.3*  --  8.2*  HCT  --  28.1*  --  24.8*  PLT  --  165  --  134*  CREATININE 0.95 0.82 0.97  --     Estimated Creatinine Clearance: 41.3 mL/min (by C-G formula based on Cr of 0.97).   Medical History: Past Medical History  Diagnosis Date  . Hypertension   . UTI (lower urinary tract infection)   . Neck pain   . Headache     Medications:  Prescriptions prior to admission  Medication Sig Dispense Refill Last Dose  . lisinopril (PRINIVIL,ZESTRIL) 20 MG tablet Take 20 mg by mouth at bedtime.   08/11/2015  . Multiple Vitamin (MULTIVITAMIN) tablet Take 1 tablet by mouth at bedtime.    08/11/2015  . omeprazole (PRILOSEC OTC) 20 MG tablet Take 20 mg by mouth at bedtime.    08/11/2015    Assessment: 82 yo lady to start heparin for DVT in L subclavian vein.  She had partial bowel resection on 1/16 and has had trivial UGIB.  Hg today 8.6, PTLC 134.  No bolus per MD Goal of Therapy:  Heparin level 0.3-0.7  units/ml Monitor platelets by anticoagulation protocol: Yes   Plan:  Start heparin drip with no bolus at 850 units/hr Check heparin level ~8 hours after start Daily HL and CBC while on heparin Monitor for bleeding complications  Thanks for allowing pharmacy to be a part of this patient's care.  Talbert Cage, PharmD Clinical Pharmacist 08/27/2015,10:23 AM  Addum:  Heparin level <0.1 units/ml.  No issues with infusion per RN.  Will increase drip to 1050 units/hr.  F/u am labs

## 2015-08-27 NOTE — Progress Notes (Signed)
Physical Therapy Treatment Patient Details Name: Priscilla Wu MRN: [id number] DOB: May 02, 1926 Today's Date: 08/27/2015    History of Present Illness 82yo white female who comes to [facility] on 1/12 p N/V. ED imaging reveals SBO and large hiatal hernia. Ex lap c partial bowel resection on 1/16, recovering very well per surgery, however vitals outside of normal limits. PMH: HTN, afib c RVR, UTI, neck pain. At baseline pt lives alone, mostly household ambulation s AD, fully indep in ADL, with partial assistance for IADL. Today HR more controled, with RR remaining elevated.     PT Comments    Pt tolerating treatment session well, motivated and able to complete entire PT sesssion as planned. Pt continues to make progress toward goals as evidenced by tolerance of higher number of reps. Pt's greatest limitation continues to be global weakness which continues to limit ability to perform all mobility at baseline function. Pt remains tachypnic but saturated WNL, and is able to correct when cued, however daughter reports that she is a shallow mouth breather at baseline. HR remains elevated 100's-110's but stable throughout, free of signs of acute distress or poor response to therex in bed. Patient presenting with impairment of strength, pain, range of motion, balance, and activity tolerance, limiting ability to perform ADL and mobility tasks at  baseline level of function. Patient will benefit from skilled intervention to address the above impairments and limitations, in order to restore to prior level of function, improve patient safety upon discharge, and to decrease caregiver burden.    Follow Up Recommendations  SNF (DC planning looking in to DC to [facility]; pt still appropriate for skilled PT services. )     Equipment Recommendations  None recommended by PT    Recommendations for Other Services       Precautions / Restrictions Precautions Precautions: Fall Restrictions Weight Bearing Restrictions: No     Mobility  Bed Mobility Overal bed mobility:  (No mobility attempted this session. )                Transfers                    Ambulation/Gait                 Stairs            Wheelchair Mobility    Modified Rankin (Stroke Patients Only)       Balance                                    Cognition Arousal/Alertness: Awake/alert Behavior During Therapy: WFL for tasks assessed/performed Overall Cognitive Status: Within Functional Limits for tasks assessed                      Exercises General Exercises - Lower Extremity Ankle Circles/Pumps: Both;Supine;AAROM;Strengthening;20 reps Short Arc Quad: AAROM;Both;Supine;15 reps;Strengthening Heel Slides: AAROM;Both;Supine;15 reps;Strengthening Hip ABduction/ADduction: AAROM;Both;Supine;Strengthening;15 reps Straight Leg Raises: AAROM;Strengthening;Both;10 reps;Supine Other Exercises Other Exercises: Bridging initiation, knees at 90 (isometrics) 1x10  Other Exercises: BUE short lever scaption: 1x12 bilat AAROM (min-ModA)     General Comments        Pertinent Vitals/Pain Pain Assessment: No/denies pain    Home Living                      Prior Function  PT Goals (current goals can now be found in the care plan section) Acute Rehab PT Goals Patient Stated Goal: regain strength and indep.  PT Goal Formulation: With patient/family Time For Goal Achievement: 09/08/15 Potential to Achieve Goals: Fair Progress towards PT goals: Progressing toward goals    Frequency  Min 3X/week    PT Plan Current plan remains appropriate    Co-evaluation             End of Session Equipment Utilized During Treatment: Gait belt Activity Tolerance: Patient tolerated treatment well Patient left: in chair;with call bell/phone within reach;with family/visitor present     Time: 0454-0981 PT Time Calculation (min) (ACUTE ONLY): 24 min  Charges:   $Therapeutic Exercise: 23-37 mins                    G Codes:      10:27 AM, 2015-08-28 Rosamaria Lints, PT, DPT PRN Physical Therapist at [facility] License # [id number] [phone] (wireless)  [phone] (mobile)

## 2015-08-27 NOTE — Progress Notes (Signed)
[facility] PROGRESS NOTE  Priscilla Wu ZOX:[id number] DOB: [birth year] DOA: 08/06/2015 PCP: Alice Reichert, MD  Assessment/Plan: 1. SBO, s/p ex lap with partial bowel resection on 1/16. No evidence of post op complications by CT 1/20. Tolerating solids. 2. Anasarca. Secondary to aggressive volume resuscitation, low albumin. Currently on IV Lasix. Follow urine output. 3. Bilateral pleural effusions, revealed on CT Chest / AP. Exam improved, no indication for tap at this time.  Repeat CXR 1/24 stable. Continue IV diuresis.  4. Hypotension. Patient had an episode of hypotension overnight with a BP in the 50-60 range. Given 2 doses of albumin and started on phenylephrine with improvement. Lactic acid was elevated, secondary to hypotension but has since trended down. Will repeat blood cultures to rule out any bacteremia. She has been afebrile and there is no convincing evidence of infection.  No evidence of sepsis/infection. Will check CVP. Continue to wean pressors as tolerated. If clinically is not improved in 24-48 hrs, may need to repeat ct scans. 5. Possible left subclavian vein thrombus associated with indwelling PICC line. Revealed on venous doppler. Will begin on anticoagulation. Will remove PICC line. 6. Hypokalemia, likely related to Lasix. Improved, will continue to replete. 7. Hypomagnesemia, replaced.  8. ABLA, secondary to trivial UGIB, anemia of critical illness. No evidence of bleeding. Appreciate GI input. No further evaluation planned.  9. Low-normal systolic function with wall motion abnormalities.   Per cardiology continue digoxin. She did not tolerate beta blockers due to hypotension.  10. Acute hypoxic respiratory failure. Improving. Secondary to bilateral compressive atelectasis, HH, pleural effusions. Continue to wean O2 as tolerated.  11. Large hiatal hernia, continue PPI.  12. Atrial fibrillation with RVR, remains in sinus rhythm/ST. Poor candidate for  anticoagulation. Will monitor HR.   13. AKI, likely ATN secondary to hypotension. Resolved.  14. Leukocytosis. Unclear if this is related to worsening infection. Patient has been afebrile. Is receiving IV steroids and also may have an element of hemoconcentration from Lasix.  LFTs wnl. CT Chest/AP reveals bilateral pleural effusions compressive atelectasis. No clinical evidence of pancreatitis, and lipase WNL. She does have an underlying DVT which could be contributing to leukocytosis. Continue to follow. 15. Hypoalbuminemia. Improved  Code Status: DNR confirmed with POA at bedside  DVT prophylaxis: SCDs Family Communication: Daughter at bedside.  Disposition Plan: Continue to monitor in ICU. Consider possible transfer to LTAC.   Consultants:  General Surgery   GI  Procedures:  Ex Lap and partial small bowel resection 1/16  PICC line placed  ECHO Study Conclusions  - Procedure narrative: Transthoracic echocardiography. Image quality was suboptimal. The study was technically difficult, as a result of poor acoustic windows and restricted patient mobility. - Left ventricle: The cavity size was normal. Wall thickness was increased in a pattern of mild LVH. There is inferoseptal hypokinesis to akinesis, consistent with prior infarct. Systolic function was mildly reduced. The estimated ejection fraction was in the range of 45% to 50%. The study is not technically sufficient to allow evaluation of LV diastolic function. - Aortic valve: Structurally normal valve. Trileaflet. Calcified annulus. Transvalvular velocity was within the normal range. There was no stenosis. There was trivial regurgitation. - Aortic root: The aortic root was top normal in size at 3.8 cm. - Left atrium: The atrium was mildly dilated. - Tricuspid valve: There was mild regurgitation. - Pulmonary arteries: PA peak pressure: 31 mm Hg (S) + RAP. - Systemic veins: Not  visualized.  Antibiotics:  Levaquin 1/21>>  Flagyl 1/19>>1/26  HPI/Subjective: Feels fine. Is not coughing much. Denies any abdominal pain.   Objective: Filed Vitals:   08/27/15 0630 08/27/15 0645  BP: 109/65 104/44  Pulse: 72 97  Temp:    Resp: 20 26    Intake/Output Summary (Last 24 hours) at 08/27/15 0719 Last data filed at 08/27/15 0649  Gross per 24 hour  Intake 1597.13 ml  Output   1050 ml  Net 547.13 ml   Filed Weights   08/25/15 0500 08/26/15 0500 08/27/15 0500  Weight: 72 kg (158 lb 11.7 oz) 69.3 kg (152 lb 12.5 oz) 71.2 kg (156 lb 15.5 oz)    Exam:  General: NAD, looks comfortable Cardiovascular: Tachycardic, S1, S2  Respiratory:  Diminished breath sounds at bases. No wheezing.   Abdomen: soft, non tender, no distention , bowel sounds normal Musculoskeletal: 1-2+ edema in extremities bilaterally   Data Reviewed: Basic Metabolic Panel:  Recent Labs Lab 08/22/15 0740 08/23/15 0430 08/24/15 0430 08/25/15 0400 08/26/15 0440 08/26/15 2008 08/27/15 0430  NA 135 137 139 139 141  --  137  K 3.1* 3.1* 2.6* 3.7 2.4*  --  3.3*  CL 103 103 100* 100* 98*  --  98*  CO2 35*  --  30  GLUCOSE 140* 137* 129* 131* 132*  --  140*  BUN 30* 32* 35* 35* 34*  --  39*  CREATININE 1.18* 1.09* 1.07* 0.95 0.82  --  0.97  CALCIUM 7.5* 7.8* 7.7* 7.5* 7.2*  --  7.5*  MG 1.3*  --  1.4*  --  1.3* 2.0  --   PHOS 2.3*  --  2.6  --   --  1.8*  --    Liver Function Tests:  Recent Labs Lab 08/22/15 0740 08/24/15 0430 08/27/15 0430  AST ALT 13* 11* 12*  ALKPHOS 94 77 70  BILITOT 0.5 0.5 0.7  PROT 3.8* 4.3* 4.4*  ALBUMIN 1.6* 2.5* 3.1*    CBC:  Recent Labs Lab 08/21/15 1600 08/22/15 0416 08/23/15 0430 08/24/15 0430 08/26/15 0440  WBC 33.5* 29.3* 13.9* 15.4* 21.7*  NEUTROABS  --  26.3*  --  13.7*  --   HGB 9.7* 9.2* 8.5* 8.7* 9.3*  HCT 29.7* 30.3* 25.9* 26.3* 28.1*  MCV 84.1 89.6 82.5 82.4 82.2  PLT 125* 120* 110* 147* 165      CBG:  Recent Labs Lab 08/22/15 2314 08/23/15 0356 08/23/15 0757 08/23/15 1144 08/23/15 1613  GLUCAP 129* 115* 141* 188* 164*    Recent Results (from the past 240 hour(s))  Culture, blood (x 2)     Status: None   Collection Time: 08/20/15  9:44 AM  Result Value Ref Range Status   Specimen Description BLOOD  Final   Special Requests NONE  Final   Culture NO GROWTH 5 DAYS  Final   Report Status 08/25/2015 FINAL  Final  Culture, blood (x 2)     Status: None   Collection Time: 08/20/15  9:55 AM  Result Value Ref Range Status   Specimen Description BLOOD  Final   Special Requests NONE  Final   Culture NO GROWTH 5 DAYS  Final   Report Status 08/25/2015 FINAL  Final     Studies: US Venous Img Upper Uni Right  08/26/2015  CLINICAL DATA:  Edema, right upper extremity swelling x3 days. Indwelling left PICC line. EXAM: RIGHT UPPER EXTREMITY VENOUS DOPPLER ULTRASOUND TECHNIQUE: Gray-scale sonography with graded compression, as well as color Doppler and duplex  ultrasound were performed to evaluate the upper extremity deep venous system from the level of the subclavian vein and including the jugular, axillary, basilic and upper cephalic vein. Spectral Doppler was utilized to evaluate flow at rest and with distal augmentation maneuvers. limited images of the contralateral subclavian vein. COMPARISON:  None. FINDINGS: RIGHT UPPER EXTREMITY Internal Jugular Vein: No evidence of thrombus. Normal compressibility, respiratory phasicity and response to augmentation. Subclavian Vein: No evidence of thrombus. Normal compressibility, respiratory phasicity and response to augmentation. Axillary Vein: No evidence of thrombus. Normal compressibility, respiratory phasicity and response to augmentation. Cephalic Vein: No evidence of thrombus. Normal compressibility, respiratory phasicity and response to augmentation. Basilic Vein: No evidence of thrombus. Normal compressibility, respiratory phasicity and response  to augmentation. Brachial Veins: No evidence of thrombus. Normal compressibility, respiratory phasicity and response to augmentation. Radial Veins: No evidence of thrombus. Normal compressibility, respiratory phasicity and response to augmentation. Ulnar Veins: No evidence of thrombus. Normal compressibility, respiratory phasicity and response to augmentation. Venous Reflux:  None. LEFT UPPER EXTREMITY Subclavian Vein: PICC line is noted. There is limited color Doppler signal suggesting adjacent venous thrombus. No flow is documented on Doppler. IMPRESSION: 1. Negative for right upper extremity DVT. 2. Possible left subclavian vein thrombus associated with indwelling PICC line. Electronically Signed   By: Corlis Leak M.D.   On: 08/26/2015 16:16   Dg Chest Port 1 View  08/25/2015  CLINICAL DATA:  Cough and shortness of Breath EXAM: PORTABLE CHEST 1 VIEW COMPARISON:  08/20/2015 FINDINGS: Cardiac shadow is mildly enlarged but stable. Bilateral lower lobe consolidation with associated effusions are again seen. No new focal infiltrate is seen. No bony abnormality is noted. IMPRESSION: Bibasilar consolidation and effusion stable from the previous exam. Electronically Signed   By: Alcide Clever M.D.   On: 08/25/2015 09:05    Scheduled Meds: . digoxin  0.125 mg Oral Daily  . feeding supplement  1 Container Oral TID BM  . furosemide  40 mg Intravenous 3 times per day  . hydrocortisone sod succinate (SOLU-CORTEF) inj  50 mg Intravenous Q12H  . levofloxacin (LEVAQUIN) IV  750 mg Intravenous Q48H  . metoprolol tartrate  12.5 mg Oral BID  . metronidazole  500 mg Intravenous Q8H  . pantoprazole  40 mg Oral BID AC   Continuous Infusions: . phenylephrine (NEO-SYNEPHRINE) Adult infusion 80 mcg/min (08/27/15 0649)    Principal Problem:   SBO (small bowel obstruction) (HCC) Active Problems:   Hypertension   Nausea and vomiting in adult patient   AKI (acute kidney injury) (HCC)   Atrial fibrillation with RVR  (HCC)   Hypotension    Time spent: 30 minutes   Jehanzeb Memon. MD [facility] Pager [phone]. If 7PM-7AM, please contact night-coverage at [email or web address], password [facility] 08/27/2015, 7:19 AM  LOS: 14 days     By signing my name below, I, Burnett Harry, attest that this documentation has been prepared under the direction and in the presence of [employer]. MD Electronically Signed: Burnett Harry, Scribe. 08/27/2015 9:31am  I, Dr. Erick Blinks, personally performed the services described in this documentaiton. All medical record entries made by the scribe were at my direction and in my presence. I have reviewed the chart and agree that the record reflects my personal performance and is accurate and complete  Erick Blinks, MD, 08/27/2015 10:12 AM

## 2015-08-28 ENCOUNTER — Inpatient Hospital Stay (HOSPITAL_COMMUNITY): Payer: Medicare Other

## 2015-08-28 ENCOUNTER — Encounter (HOSPITAL_COMMUNITY): Payer: Self-pay | Admitting: Primary Care

## 2015-08-28 DIAGNOSIS — K5669 Other intestinal obstruction: Secondary | ICD-10-CM

## 2015-08-28 DIAGNOSIS — Z7189 Other specified counseling: Secondary | ICD-10-CM | POA: Diagnosis not present

## 2015-08-28 DIAGNOSIS — N179 Acute kidney failure, unspecified: Secondary | ICD-10-CM

## 2015-08-28 DIAGNOSIS — E43 Unspecified severe protein-calorie malnutrition: Secondary | ICD-10-CM | POA: Insufficient documentation

## 2015-08-28 DIAGNOSIS — D62 Acute posthemorrhagic anemia: Secondary | ICD-10-CM

## 2015-08-28 DIAGNOSIS — Z515 Encounter for palliative care: Secondary | ICD-10-CM | POA: Insufficient documentation

## 2015-08-28 DIAGNOSIS — J9601 Acute respiratory failure with hypoxia: Secondary | ICD-10-CM

## 2015-08-28 DIAGNOSIS — I959 Hypotension, unspecified: Secondary | ICD-10-CM

## 2015-08-28 LAB — COMPREHENSIVE METABOLIC PANEL
ALBUMIN: 2.4 g/dL — AB (ref 3.5–5.0)
ALK PHOS: 77 U/L (ref 38–126)
ALT: 12 U/L — AB (ref 14–54)
AST: 19 U/L (ref 15–41)
Anion gap: 10 (ref 5–15)
BUN: 28 mg/dL — ABNORMAL HIGH (ref 6–20)
CALCIUM: 7.3 mg/dL — AB (ref 8.9–10.3)
CHLORIDE: 95 mmol/L — AB (ref 101–111)
CO2: 29 mmol/L (ref 22–32)
CREATININE: 0.77 mg/dL (ref 0.44–1.00)
GFR calc Af Amer: >60 mL/min (ref >60)
GFR calc non Af Amer: >60 mL/min (ref >60)
GLUCOSE: 133 mg/dL — AB (ref 65–99)
Potassium: 2.9 mmol/L — ABNORMAL LOW (ref 3.5–5.1)
SODIUM: 134 mmol/L — AB (ref 135–145)
Total Bilirubin: 0.6 mg/dL (ref 0.3–1.2)
Total Protein: 4.4 g/dL — ABNORMAL LOW (ref 6.5–8.1)

## 2015-08-28 LAB — CBC
HEMATOCRIT: 33.2 % — AB (ref 36.0–46.0)
Hemoglobin: 10.9 g/dL — ABNORMAL LOW (ref 12.0–15.0)
MCH: 26.5 pg (ref 26.0–34.0)
MCHC: 32.8 g/dL (ref 30.0–36.0)
MCV: 80.8 fL (ref 78.0–100.0)
PLATELETS: 155 10*3/uL (ref 150–400)
RBC: 4.11 MIL/uL (ref 3.87–5.11)
RDW: 15.1 % (ref 11.5–15.5)
WBC: 34.2 10*3/uL — AB (ref 4.0–10.5)

## 2015-08-28 LAB — HEPARIN LEVEL (UNFRACTIONATED): Heparin Unfractionated: 0.31 IU/mL (ref 0.30–0.70)

## 2015-08-28 MED ORDER — POTASSIUM CHLORIDE IN NACL 40-0.9 MEQ/L-% IV SOLN
INTRAVENOUS | Status: DC
  Administered 2015-08-28 – 2015-08-29 (×3): 75 mL/h via INTRAVENOUS

## 2015-08-28 MED ORDER — POTASSIUM CHLORIDE 2 MEQ/ML IV SOLN
INTRAVENOUS | Status: DC
  Filled 2015-08-28 (×6): qty 1000

## 2015-08-28 MED ORDER — ALBUMIN HUMAN 25 % IV SOLN
25.0000 g | Freq: Four times a day (QID) | INTRAVENOUS | Status: AC
  Administered 2015-08-28 (×2): 25 g via INTRAVENOUS
  Filled 2015-08-28 (×2): qty 100

## 2015-08-28 MED ORDER — POTASSIUM CHLORIDE 10 MEQ/100ML IV SOLN
10.0000 meq | INTRAVENOUS | Status: AC
  Administered 2015-08-28 (×4): 10 meq via INTRAVENOUS
  Filled 2015-08-28: qty 100

## 2015-08-28 MED ORDER — SODIUM CHLORIDE 0.9 % IV SOLN
500.0000 mg | Freq: Three times a day (TID) | INTRAVENOUS | Status: DC
  Administered 2015-08-28 – 2015-08-29 (×5): 500 mg via INTRAVENOUS
  Filled 2015-08-28 (×10): qty 500

## 2015-08-28 MED ORDER — VANCOMYCIN HCL 10 G IV SOLR
1500.0000 mg | Freq: Once | INTRAVENOUS | Status: AC
  Administered 2015-08-28: 1500 mg via INTRAVENOUS
  Filled 2015-08-28: qty 1500

## 2015-08-28 MED ORDER — VANCOMYCIN HCL 10 G IV SOLR
1250.0000 mg | INTRAVENOUS | Status: DC
  Administered 2015-08-29: 1250 mg via INTRAVENOUS
  Filled 2015-08-28 (×3): qty 1250

## 2015-08-28 NOTE — Progress Notes (Signed)
CSW spoke with patient's son/HCPOA this morning by phone- updated him in regards to plans for SNF-vs- LTACH. He has also been updated by MD. CSW offered support and answered all questions. Will follow for appropriate dc planning support.   Reece Levy, MSW, [facility]  [phone]

## 2015-08-28 NOTE — Progress Notes (Signed)
ANTICOAGULATION CONSULT NOTE   Pharmacy Consult for heparin Indication: DVT  Allergies  Allergen Reactions  . Penicillins Hives and Rash    Has patient had a PCN reaction causing immediate rash, facial/tongue/throat swelling, SOB or lightheadedness with hypotension: Yes Has patient had a PCN reaction causing severe rash involving mucus membranes or skin necrosis: No Has patient had a PCN reaction that required hospitalization No Has patient had a PCN reaction occurring within the last 10 years: No If all of the above answers are "NO", then may proceed with Cephalosporin use.    Patient Measurements: Height:  (167.6 cm) Weight: 156 lb 15.5 oz (71.2 kg) IBW/kg (Calculated) : 59.3 Heparin Dosing Weight: 59.4  Vital Signs: Temp: 98.2 F (36.8 C) (01/27 0400) Temp Source: Axillary (01/27 0400) BP: 78/55 mmHg (01/27 0630) Pulse Rate: 118 (01/27 0630)  Labs:  Recent Labs  08/26/15 0440 08/27/15 0430 08/27/15 0926 08/27/15 1955 08/28/15 0440  HGB 9.3*  --  8.2*  --  10.9*  HCT 28.1*  --  24.8*  --  33.2*  PLT 165  --  134*  --  155  HEPARINUNFRC  --   --   --  <0.10* 0.31  CREATININE 0.82 0.97  --   --  0.77   Estimated Creatinine Clearance: 50.1 mL/min (by C-G formula based on Cr of 0.77).  Medical History: Past Medical History  Diagnosis Date  . Hypertension   . UTI (lower urinary tract infection)   . Neck pain   . Headache    Medications:  Prescriptions prior to admission  Medication Sig Dispense Refill Last Dose  . lisinopril (PRINIVIL,ZESTRIL) 20 MG tablet Take 20 mg by mouth at bedtime.   08/11/2015  . Multiple Vitamin (MULTIVITAMIN) tablet Take 1 tablet by mouth at bedtime.    08/11/2015  . omeprazole (PRILOSEC OTC) 20 MG tablet Take 20 mg by mouth at bedtime.    08/11/2015   Assessment: 82 yo lady to start heparin for DVT in L subclavian vein.  She had partial bowel resection on 1/16 and has had trivial UGIB.  Hg today 10.9, PTLC 155 which is improved.    No bleeding reported.   Goal of Therapy:  Heparin level 0.3-0.7 units/ml Monitor platelets by anticoagulation protocol: Yes   Plan:  Continue Heparin drip at 1050 units/hr (current rate) Check heparin level daily with CBC Monitor for bleeding complications  Thanks for allowing pharmacy to be a part of this patient's care.  Valrie Hart, PharmD Clinical Pharmacist 08/28/2015,7:47 AM

## 2015-08-28 NOTE — Care Management Important Message (Signed)
Important Message  Patient Details  Name: Priscilla Wu MRN: [id number] Date of Birth: 18-Sep-1925   Medicare Important Message Given:  Yes    Malcolm Metro, RN 08/28/2015, 1:59 PM

## 2015-08-28 NOTE — Consult Note (Signed)
Consultation Note Date: 08/28/2015   Patient Name: Priscilla Wu  DOB: 1926/06/14  MRN: [id number]  Age / Sex: 82 y.o., female  PCP: Butch Penny, MD Referring Physician: Erick Blinks, MD  Reason for Consultation: Establishing goals of care and Psychosocial/spiritual support    Clinical Assessment/Narrative: Priscilla Wu is lying quietly in bed with her children, [name] and [name], at bedside (Gary's wife arrives later).  She opens her eyes easily and talks with me.  She denies pain.  I share that she has had a long road so far, she states, "yes".  I ask who helps her make decisions, and she tells me that she makes her own.  She states she would like her children to make choices if she is unable.  She agrees for me to talk with them.   [name] and [name] tell me that Priscilla Wu has been living in her own home, on her own.  She is able to cook and clean, but [name] gets groceries (she sometimes goes also).  She was doing short bouts of yard work this fall.  We talk about the seriousness of her health issues, and they tell me they are aware that she is very ill.     I share with Priscilla Wu that she likely has a long row in front of her, and she nods yes.   We talk about if her heart were to naturally stop, we wound not push on her chest to try and restart it.  She states "yes, my children all know this".  I share that otherwise, we are continuing to do what we can with medications.     I ask if she will be able to say when she doesn't want to continue treatment.  She tells me, "my children will know".   I talk with [name] and [name] about this, they tell me that they understand that she is asking then to make this decision.    We talk about two paths, one focused on aggressive care, the other focused on comfort.  I reassure them that the medical team will do what [name] and they feel is best for her, and they will know what is right  for their mother.   They request I talk with their oldest brother, Artis.   Artis Lucia calls me later in the day and we talk about Priscilla Wu condition.  He shares his concerns over finding out what is causing her declines, stating he understands that she has many problems at this time.  He talks about LTAC, and his concerns over her surviving the transport.  I assure him that we, and she, are doing all we can to improve outcomes.    Contacts/Participants in Discussion: [name], children [name] (his wife) and [name] at bedside.  Son, Artis by phone later.   Primary Decision Maker: Priscilla Wu is able to make her own decisions, but shares that if she is unable, she wants her children to make choices for her.  [name] and [name] state they follow the lead of their oldest brother, Artis.  Relationship to Patient adult children HCPOA: yes  [name], lives in [place].   SUMMARY OF RECOMMENDATIONS  Code Status/Advance Care Planning: DNR - but continue to treat the treatable with medications including vasopressors.     Code Status Orders        Start     Ordered   08/19/15 1002  Do not attempt resuscitation (DNR)   Continuous  Question Answer Comment  In the event of cardiac or respiratory ARREST Do not call a "code blue"   In the event of cardiac or respiratory ARREST Do not perform Intubation, CPR, defibrillation or ACLS   In the event of cardiac or respiratory ARREST Use medication by any route, position, wound care, and other measures to relive pain and suffering. May use oxygen, suction and manual treatment of airway obstruction as needed for comfort.      08/19/15 1002    Code Status History    Date Active Date Inactive Code Status Order ID Comments User Context   08/14/2015  3:57 PM 08/19/2015 10:02 AM Full Code [id number]  [name], MD Inpatient   08/13/2015  3:41 AM 08/31/2015  3:57 PM DNR [id number]  [name], MD Inpatient   06/17/2014  5:48 AM 06/18/2014  5:44 PM Full Code  [id number]  [name], MD ED      Other Directives:None  Symptom Management:   Per hospitalist.   Palliative Prophylaxis:   Frequent Pain Assessment, Oral Care and Turn Reposition  Additional Recommendations (Limitations, Scope, Preferences):  Treat the treatable at this point.   Psycho-social/Spiritual:  Support System: Strong Desire for further Chaplaincy support:Not discussed today.  Additional Recommendations: None at this time.   Prognosis: Unable to determine, based on outcomes.   Discharge Planning: treat in place vs LTAC   Chief Complaint/ Primary Diagnoses: Present on Admission:  . Nausea and vomiting in adult patient . (Resolved) Small bowel obstruction (HCC) . Hypertension  I have reviewed the medical record, interviewed the patient and family, and examined the patient. The following aspects are pertinent.  Past Medical History  Diagnosis Date  . Hypertension   . UTI (lower urinary tract infection)   . Neck pain   . Headache    Social History   Social History  . Marital Status: Widowed    Spouse Name: N/A  . Number of Children: N/A  . Years of Education: N/A   Social History Main Topics  . Smoking status: Never Smoker   . Smokeless tobacco: None  . Alcohol Use: No  . Drug Use: No  . Sexual Activity: Not Asked   Other Topics Concern  . None   Social History Narrative   History reviewed. No pertinent family history. Scheduled Meds: . albumin human  25 g Intravenous Q6H  . digoxin  0.125 mg Oral Daily  . feeding supplement  1 Container Oral TID BM  . hydrocortisone sod succinate (SOLU-CORTEF) inj  50 mg Intravenous Q12H  . imipenem-cilastatin  500 mg Intravenous Q8H  . pantoprazole  40 mg Oral BID AC  . potassium chloride  10 mEq Intravenous Q1 Hr x 4  . [START ON 09-16-2015] vancomycin  1,250 mg Intravenous Q24H   Continuous Infusions: . 0.9 % NaCl with KCl 40 mEq / L 75 mL/hr (08/28/15 1045)  . heparin Stopped (08/28/15 0600)    . phenylephrine (NEO-SYNEPHRINE) Adult infusion 110 mcg/min (08/28/15 0835)   PRN Meds:.acetaminophen **OR** acetaminophen, ipratropium, morphine injection, ondansetron **OR** ondansetron (ZOFRAN) IV Medications Prior to Admission:  Prior to Admission medications   Medication Sig Start Date End Date Taking? Authorizing Provider  lisinopril (PRINIVIL,ZESTRIL) 20 MG tablet Take 20 mg by mouth at bedtime.   Yes Historical Provider, MD  Multiple Vitamin (MULTIVITAMIN) tablet Take 1 tablet by mouth at bedtime.     Historical Provider, MD  omeprazole (PRILOSEC OTC) 20 MG tablet Take 20 mg by mouth at bedtime.  Historical Provider, MD   Allergies  Allergen Reactions  . Penicillins Hives and Rash    Has patient had a PCN reaction causing immediate rash, facial/tongue/throat swelling, SOB or lightheadedness with hypotension: Yes Has patient had a PCN reaction causing severe rash involving mucus membranes or skin necrosis: No Has patient had a PCN reaction that required hospitalization No Has patient had a PCN reaction occurring within the last 10 years: No If all of the above answers are "NO", then may proceed with Cephalosporin use.     Review of Systems  Unable to perform ROS: Acuity of condition    Physical Exam  Nursing note and vitals reviewed. Constitutional:  Frail, elderly  HENT:  Head: Normocephalic and atraumatic.  Cardiovascular: Regular rhythm.   tachy  Respiratory:  RR high 30's.   GI: Soft. She exhibits no distension.  Neurological: She is alert.  Opens eyes easily, able to participate in care.     Vital Signs: BP 90/50 mmHg  Pulse 107  Temp(Src) 98.2 F (36.8 C) (Axillary)  Resp 29  Ht  (1.676 m)  Wt 71.2 kg (156 lb 15.5 oz)  BMI 25.35 kg/m2  SpO2 98%  SpO2: SpO2: 98 % O2 Device:SpO2: 98 % O2 Flow Rate: .O2 Flow Rate (L/min): 2 L/min  IO: Intake/output summary:  Intake/Output Summary (Last 24 hours) at 08/28/15 1413 Last data filed at 08/28/15  0600  Gross per 24 hour  Intake 1520.9 ml  Output   2150 ml  Net -629.1 ml    LBM: Last BM Date: 08/27/15 Baseline Weight: Weight: 63.504 kg (140 lb) Most recent weight: Weight: 71.2 kg (156 lb 15.5 oz)      Palliative Assessment/Data:  Flowsheet Rows        Most Recent Value   Intake Tab    Referral Department  Hospitalist   Unit at Time of Referral  ICU   Palliative Care Primary Diagnosis  Other (Comment)   Date Notified  08/28/15   Palliative Care Type  New Palliative care   Reason for referral  Clarify Goals of Care, Psychosocial or Spiritual support   Date of Admission  08/26/2015   Date first seen by Palliative Care  08/28/15   # of days Palliative referral response time  0 Day(s)   # of days IP prior to Palliative referral  16   Clinical Assessment    Palliative Performance Scale Score  20%   Pain Max last 24 hours  Other (Comment)   Pain Min Last 24 hours  Other (Comment)   Dyspnea Max Last 24 Hours  Other (Comment)   Dyspnea Min Last 24 hours  Other (Comment)   Psychosocial & Spiritual Assessment    Social Work Plan of Care  Staff support, Participated in Family meeting   Palliative Care Outcomes    Patient/Family meeting held?  Yes   Who was at the meeting?  son and daughter, [name] and [name],  son Artis by phone   Palliative Care Outcomes  Clarified goals of care, Provided psychosocial or spiritual support   Patient/Family wishes: Interventions discontinued/not started   Mechanical Ventilation, Trach   Palliative Care follow-up planned  Yes, Facility      Additional Data Reviewed:  CBC:    Component Value Date/Time   WBC 34.2* 08/28/2015 0440   HGB 10.9* 08/28/2015 0440   HCT 33.2* 08/28/2015 0440   PLT 155 08/28/2015 0440   MCV 80.8 08/28/2015 0440   NEUTROABS 13.7* 08/24/2015 0430  LYMPHSABS 0.7 08/24/2015 0430   MONOABS 1.0 08/24/2015 0430   EOSABS 0.0 08/24/2015 0430   BASOSABS 0.1 08/24/2015 0430   Comprehensive Metabolic Panel:    Component  Value Date/Time   NA 134* 08/28/2015 0440   K 2.9* 08/28/2015 0440   CL 95* 08/28/2015 0440   CO2 29 08/28/2015 0440   BUN 28* 08/28/2015 0440   CREATININE 0.77 08/28/2015 0440   GLUCOSE 133* 08/28/2015 0440   CALCIUM 7.3* 08/28/2015 0440   AST 19 08/28/2015 0440   ALT 12* 08/28/2015 0440   ALKPHOS 77 08/28/2015 0440   BILITOT 0.6 08/28/2015 0440   PROT 4.4* 08/28/2015 0440   ALBUMIN 2.4* 08/28/2015 0440     Time In: 1005 Time Out: 1115 Time Total:  70 minutes  GOC discussion shared with nursing staff, CM, SW, and Dr. Kerry Hough.  Greater than 50%  of this time was spent counseling and coordinating care related to the above assessment and plan.  Signed by: Katheran Awe, NP  Katheran Awe, NP  08/28/2015, 2:13 PM  Please contact Palliative Medicine Team phone at [phone] for questions and concerns.

## 2015-08-28 NOTE — Progress Notes (Signed)
[facility] PROGRESS NOTE  Priscilla Wu ZOX:[id number] DOB: 1926-05-26 DOA: 08/16/15 PCP: Alice Reichert, MD  Assessment/Plan: 1. SBO, s/p ex lap with partial bowel resection on 1/16. No evidence of post op complications by CT 1/20. Tolerating solids. 2. Anasarca. Secondary to aggressive volume resuscitation, low albumin. Currently on IV Lasix. Although with low CVP and hypotension. She is likely becoming intravascularly volume depleted. Will have to hold off on further lasix for now.  Follow urine output. 3. Bilateral pleural effusions, revealed on CT Chest / AP.  Patient still short of breath despite IV diuresis, will proceed with thoracentesis. 4. Hypotension. Patient continues to be hypotensive requiring phenylephrine. CVP is low at 5. She may be intravascularly depleted from aggressive diuresis. Will start patient on gentle hydration, albumin infusions. Continue to wean pressors as tolerated.  5. Possible left subclavian vein thrombus associated with indwelling PICC line. Patient started on anticoagulants. Will order dedicated LUE venous doppler to confirm presence of thrombus.  Order placed to remove left picc line and consider placing right picc line for access 6. Hypokalemia, likely related to Lasix. 7. Hypomagnesemia, replaced.  8. ABLA, secondary to trivial UGIB, anemia of critical illness. No evidence of bleeding. Appreciate GI input. No further evaluation planned.  9. Low-normal systolic function with wall motion abnormalities.   Per cardiology continue digoxin. She did not tolerate beta blockers due to hypotension.  10. Acute hypoxic respiratory failure. Improving. Secondary to bilateral compressive atelectasis, hiatal hernia, pleural effusions. Continue to wean O2 as tolerated.  11. Large hiatal hernia, continue PPI.  12. Atrial fibrillation with RVR, remains in sinus rhythm/ST. Poor candidate for anticoagulation. Will monitor HR.   13. AKI, likely ATN secondary to  hypotension. Resolved.  14. Leukocytosis. Unclear if this is related to worsening infection. Patient has been afebrile. Is receiving IV steroids and also may have an element of hemoconcentration from Lasix.  LFTs wnl. CT Chest/AP reveals bilateral pleural effusions compressive atelectasis. No clinical evidence of pancreatitis, and lipase WNL. She does have an underlying DVT which could be contributing to leukocytosis. Repeat blood cultures have not sown any growth to date. Since her leukocytosis is not improving we will broaden her abx coverage to vancomycin and primaxin. Levaquin discontinued 1/27. She is not having any diarrhhea. Continue to follow. 15. Hypoalbuminemia. Improved  Code Status: DNR confirmed with POA at bedside  DVT prophylaxis: SCDs Family Communication: Daughter at bedside Disposition Plan: Continue to monitor in ICU. Consider possible transfer to LTAC.   Consultants:  General Surgery   GI  Procedures:  Ex Lap and partial small bowel resection 1/16  PICC line placed  ECHO Study Conclusions  - Procedure narrative: Transthoracic echocardiography. Image quality was suboptimal. The study was technically difficult, as a result of poor acoustic windows and restricted patient mobility. - Left ventricle: The cavity size was normal. Wall thickness was increased in a pattern of mild LVH. There is inferoseptal hypokinesis to akinesis, consistent with prior infarct. Systolic function was mildly reduced. The estimated ejection fraction was in the range of 45% to 50%. The study is not technically sufficient to allow evaluation of LV diastolic function. - Aortic valve: Structurally normal valve. Trileaflet. Calcified annulus. Transvalvular velocity was within the normal range. There was no stenosis. There was trivial regurgitation. - Aortic root: The aortic root was top normal in size at 3.8 cm. - Left atrium: The atrium was mildly dilated. - Tricuspid  valve: There was mild regurgitation. - Pulmonary arteries: PA peak pressure: 31 mm  Hg (S) + RAP. - Systemic veins: Not visualized.  Antibiotics:  Levaquin 1/21>> 1/27  Flagyl 1/19>>1/26  Vancomycin 1/27 >>  Primaxin 1/27 >>  HPI/Subjective: Paitent feels okay. Does not feel dizzy or light headed. Claims that her breathing is fine.   Objective: Filed Vitals:   08/28/15 0615 08/28/15 0630  BP: 68/45 78/55  Pulse: 116 118  Temp:    Resp: 31 38    Intake/Output Summary (Last 24 hours) at 08/28/15 0733 Last data filed at 08/28/15 0600  Gross per 24 hour  Intake 2000.9 ml  Output   2650 ml  Net -649.1 ml   Filed Weights   08/25/15 0500 08/26/15 0500 08/27/15 0500  Weight: 72 kg (158 lb 11.7 oz) 69.3 kg (152 lb 12.5 oz) 71.2 kg (156 lb 15.5 oz)    Exam:  General: NAD, looks comfortable Cardiovascular:  S1, S2 Tachycardic. Respiratory: Diminished breath sounds bilaterally.  Short shallow breaths Abdomen: soft, mildy tender, no distention , bowel sounds normal Musculoskeletal: 2+ edema b/l upper extremities    Data Reviewed: Basic Metabolic Panel:  Recent Labs Lab 08/22/15 0740  08/24/15 0430 08/25/15 0400 08/26/15 0440 08/26/15 2008 08/27/15 0430 08/28/15 0440  NA 135  < > 139 139 141  --  137 134*  K 3.1*  < > 2.6* 3.7 2.4*  --  3.3* 2.9*  CL 103  < > 100* 100* 98*  --  98* 95*  CO2 26  < > 29 31 35*  --  30 29  GLUCOSE 140*  < > 129* 131* 132*  --  140* 133*  BUN 30*  < > 35* 35* 34*  --  39* 28*  CREATININE 1.18*  < > 1.07* 0.95 0.82  --  0.97 0.77  CALCIUM 7.5*  < > 7.7* 7.5* 7.2*  --  7.5* 7.3*  MG 1.3*  --  1.4*  --  1.3* 2.0  --   --   PHOS 2.3*  --  2.6  --   --  1.8*  --   --   < > = values in this interval not displayed. Liver Function Tests:  Recent Labs Lab 08/22/15 0740 08/24/15 0430 08/27/15 0430 08/28/15 0440  AST ALT 13* 11* 12* 12*  ALKPHOS 94 77 70 77  BILITOT 0.5 0.5 0.7 0.6  PROT 3.8* 4.3* 4.4* 4.4*  ALBUMIN  1.6* 2.5* 3.1* 2.4*    CBC:  Recent Labs Lab 08/22/15 0416 08/23/15 0430 08/24/15 0430 08/26/15 0440 08/27/15 0926 08/28/15 0440  WBC 29.3* 13.9* 15.4* 21.7* 31.1* 34.2*  NEUTROABS 26.3*  --  13.7*  --   --   --   HGB 9.2* 8.5* 8.7* 9.3* 8.2* 10.9*  HCT 30.3* 25.9* 26.3* 28.1* 24.8* 33.2*  MCV 89.6 82.5 82.4 82.2 81.0 80.8  PLT 120* 110* 147* 165 134* 155     CBG:  Recent Labs Lab 08/22/15 2314 08/23/15 0356 08/23/15 0757 08/23/15 1144 08/23/15 1613  GLUCAP 129* 115* 141* 188* 164*    Recent Results (from the past 240 hour(s))  Culture, blood (x 2)     Status: None   Collection Time: 08/20/15  9:44 AM  Result Value Ref Range Status   Specimen Description BLOOD  Final   Special Requests NONE  Final   Culture NO GROWTH 5 DAYS  Final   Report Status 08/25/2015 FINAL  Final  Culture, blood (x 2)     Status: None   Collection Time:  08/20/15  9:55 AM  Result Value Ref Range Status   Specimen Description BLOOD  Final   Special Requests NONE  Final   Culture NO GROWTH 5 DAYS  Final   Report Status 08/25/2015 FINAL  Final  Culture, blood (routine x 2)     Status: None (Preliminary result)   Collection Time: 08/27/15 10:40 AM  Result Value Ref Range Status   Specimen Description BLOOD PICC LINE DRAWN BY RN  Final   Special Requests   Final    BOTTLES DRAWN AEROBIC AND ANAEROBIC AEB=8CC ANA=5CC   Culture NO GROWTH < 24 HOURS  Final   Report Status PENDING  Incomplete  Culture, blood (routine x 2)     Status: None (Preliminary result)   Collection Time: 08/27/15 10:47 AM  Result Value Ref Range Status   Specimen Description BLOOD RIGHT ANTECUBITAL  Final   Special Requests BOTTLES DRAWN AEROBIC ONLY 6CC  Final   Culture NO GROWTH < 24 HOURS  Final   Report Status PENDING  Incomplete     Studies: US Venous Img Upper Uni Right  08/26/2015  CLINICAL DATA:  Edema, right upper extremity swelling x3 days. Indwelling left PICC line. EXAM: RIGHT UPPER EXTREMITY VENOUS  DOPPLER ULTRASOUND TECHNIQUE: Gray-scale sonography with graded compression, as well as color Doppler and duplex ultrasound were performed to evaluate the upper extremity deep venous system from the level of the subclavian vein and including the jugular, axillary, basilic and upper cephalic vein. Spectral Doppler was utilized to evaluate flow at rest and with distal augmentation maneuvers. limited images of the contralateral subclavian vein. COMPARISON:  None. FINDINGS: RIGHT UPPER EXTREMITY Internal Jugular Vein: No evidence of thrombus. Normal compressibility, respiratory phasicity and response to augmentation. Subclavian Vein: No evidence of thrombus. Normal compressibility, respiratory phasicity and response to augmentation. Axillary Vein: No evidence of thrombus. Normal compressibility, respiratory phasicity and response to augmentation. Cephalic Vein: No evidence of thrombus. Normal compressibility, respiratory phasicity and response to augmentation. Basilic Vein: No evidence of thrombus. Normal compressibility, respiratory phasicity and response to augmentation. Brachial Veins: No evidence of thrombus. Normal compressibility, respiratory phasicity and response to augmentation. Radial Veins: No evidence of thrombus. Normal compressibility, respiratory phasicity and response to augmentation. Ulnar Veins: No evidence of thrombus. Normal compressibility, respiratory phasicity and response to augmentation. Venous Reflux:  None. LEFT UPPER EXTREMITY Subclavian Vein: PICC line is noted. There is limited color Doppler signal suggesting adjacent venous thrombus. No flow is documented on Doppler. IMPRESSION: 1. Negative for right upper extremity DVT. 2. Possible left subclavian vein thrombus associated with indwelling PICC line. Electronically Signed   By: Corlis Leak M.D.   On: 08/26/2015 16:16    Scheduled Meds: . digoxin  0.125 mg Oral Daily  . feeding supplement  1 Container Oral TID BM  . furosemide  40 mg  Intravenous 3 times per day  . hydrocortisone sod succinate (SOLU-CORTEF) inj  50 mg Intravenous Q12H  . levofloxacin (LEVAQUIN) IV  750 mg Intravenous Q48H  . pantoprazole  40 mg Oral BID AC   Continuous Infusions: . heparin Stopped (08/28/15 0600)  . phenylephrine (NEO-SYNEPHRINE) Adult infusion 100 mcg/min (08/28/15 0600)    Principal Problem:   SBO (small bowel obstruction) (HCC) Active Problems:   Hypertension   Nausea and vomiting in adult patient   AKI (acute kidney injury) (HCC)   Atrial fibrillation with RVR (HCC)   Hypotension    Time spent: 25 minutes   Jossiah Smoak. MD [facility] Pager [phone].  If 7PM-7AM, please contact night-coverage at [email or web address], password [facility] 08/28/2015, 7:33 AM  LOS: 15 days     By signing my name below, I, Burnett Harry, attest that this documentation has been prepared under the direction and in the presence of [facility]. MD Electronically Signed: Burnett Harry, Scribe. 08/28/2015 9:10 am   I, Dr. Erick Blinks, personally performed the services described in this documentaiton. All medical record entries made by the scribe were at my direction and in my presence. I have reviewed the chart and agree that the record reflects my personal performance and is accurate and complete  Erick Blinks, MD, 08/28/2015 9:48 AM

## 2015-08-28 NOTE — Evaluation (Signed)
Occupational Therapy Evaluation Patient Details Name: Priscilla Wu MRN: [id number] DOB: Mar 05, 1926 Today's Date: 08/28/2015    History of Present Illness 82yo white female who comes to [facility] on 1/12 p N/V. ED imaging reveals SBO and large hiatal hernia. Ex lap c partial bowel resection on 1/16, recovering very well per surgery, however vitals outside of normal limits. PMH: HTN, afib c RVR, UTI, neck pain. At baseline pt lives alone, mostly household ambulation s AD, fully indep in ADL, with partial assistance for IADL. Today HR more controled, with RR remaining elevated.    Clinical Impression   Pt awake, alert, oriented to person, place, situation, agreeable to OT evaluation. Daugther present for evaluation this am, able to provide background information. PTA pt lived alone and was independent in ADL tasks, still driving. Pt very weak, 2/5 strength in BUE. Pt participated in AA/ROM exercises for BUE, pt fatigued at end of session. Pt continues to demonstrate shallow breathing, able to correct with cuing for slow, deep breaths. HR elevated ranging from 100-120 during session, pt remains stable with therex. Pt is currently total assist for ADL tasks due to weakness and limited activity tolerance. Agree with current plan for SNF on discharge to further progress participation, independence, and safety in ADL tasks, increasing strength and overall functional status while decreasing caregiver burden. Will continue to follow while in acute care to further assess and improve functional status and ADL task completion.     Follow Up Recommendations  SNF;Supervision/Assistance - 24 hour    Equipment Recommendations  None recommended by OT       Precautions / Restrictions Precautions Precautions: Fall Restrictions Weight Bearing Restrictions: No              ADL Overall ADL's : Needs assistance/impaired                                       General ADL Comments: Pt is  currently total assist for ADL tasks due to weakness and decreased activity tolerance     Vision Vision Assessment?: No apparent visual deficits          Pertinent Vitals/Pain Pain Assessment: No/denies pain     Hand Dominance Right   Extremity/Trunk Assessment Upper Extremity Assessment Upper Extremity Assessment: Generalized weakness   Lower Extremity Assessment Lower Extremity Assessment: Defer to PT evaluation       Communication Communication Communication: HOH   Cognition Arousal/Alertness: Awake/alert Behavior During Therapy: WFL for tasks assessed/performed Overall Cognitive Status: Within Functional Limits for tasks assessed                        Exercises Exercises: General Upper Extremity       General Exercises - Upper Extremity  Shoulder Flexion AAROM;Both;10 reps;Supine  Elbow Flexion AAROM;Both;10 reps;Supine  Elbow Extension AAROM;Both;10 reps;Supine  Shoulder ABduction AAROM;Both;10 reps;Supine  Wrist Flexion AROM;Both;10 reps;Supine  Wrist Extension AROM;Both;10 reps;Supine          Home Living Family/patient expects to be discharged to:: Skilled nursing facility                             Home Equipment: Cane - quad;Grab bars - toilet          Prior Functioning/Environment Level of Independence: Independent with assistive device(s);Needs assistance    ADL's / Homemaking  Assistance Needed: Some assistance required for IADL tasks        OT Diagnosis: Generalized weakness   OT Problem List: Decreased strength;Decreased activity tolerance;Impaired UE functional use   OT Treatment/Interventions: Self-care/ADL training;Therapeutic exercise;Therapeutic activities;Patient/family education    OT Goals(Current goals can be found in the care plan section) Acute Rehab OT Goals Patient Stated Goal: regain strength and indep.   OT Frequency: Min 2X/week    End of Session    Activity Tolerance: Patient limited by  fatigue Patient left: in bed;with call bell/phone within reach;with bed alarm set;with family/visitor present   Time: 1610-9604 OT Time Calculation (min): 29 min Charges:  OT General Charges $OT Visit: 1 Procedure OT Evaluation $OT Eval Moderate Complexity: 1 Procedure  Ezra Sites, OTR/L  [phone]  08/28/2015, 9:01 AM

## 2015-08-28 NOTE — Progress Notes (Signed)
Initial Nutrition Assessment  DOCUMENTATION CODES:  Severe malnutrition in context of acute illness/injury   Pt meets criteria for SEVERE MALNUTRITION in the context of ACUTE ILLNESS as evidenced by Severe fluid accumulation, an energy intake that met <50% of estimated needs for > 5 days, severe fat wasting (orbital), and moderate muscle wasting (temporal).  INTERVENTION:  Ensure Enlive po BID, each supplement provides 350 kcal and 20 grams of protein  MVI with minerals  Downgrade meats in diet to Dysphagia 2  Meal preferences  NUTRITION DIAGNOSIS:  Increased nutrient needs related to acute illness as evidenced by estimated nutritional requirements for the  condition  GOAL:  Patient will meet greater than or equal to 90% of their needs  MONITOR:  PO intake, Supplement acceptance, Diet advancement, Labs, Weight trends, I & O's  REASON FOR ASSESSMENT:  LOS    ASSESSMENT:  82 y/o female PMHx relatively unremarkable who initially presented w/ intermittent n/v. Workup identified SBO. This did not resolve with conservative treatment and has SBR on 1/16. Pt initially did well but recently declined and has had very complicated post op course. Currently suffering from anasarca, bilateral pleural effusions, hypotension, resp failure, a fib. Pt undergoing thoracentesis today in attempt to remove pleural fluid.  Spoke with daughter at bedside as pt is no longer verbal. Pt has not had an appetite. Daughter states there have been no signs of intolerance, though while speaking with daughter pt experienced small emetic episode.   Daughter mentioned pt has been struggling with meats due to edentulous status. She thought pt would benefit from downgrading her meats to Dys 2. Pt has not had trouble with other foods.   Per EMR documentation pt has had little intake throughout admission. She was only placed on a FL diet on day 11 of her admission. She had been on a Dysphagia 3 diet for 4 days and never  ate >50% of a meal.   Daughter reports pt was doing well with ensure as pt had used these for years. Pt tried breeze today, sweetness potentially could have upset pt's stomach.  Took meal prefs once diet advanced  NFPE: upper extremities very edematous. Pt with noticeable fat/muscle wasting of face.   Labs reviewed: WBC: 34.2-increasing. Hypoalbuminemia.  Hypo-natremic/kalemic  Diet Order:  Diet NPO time specified  Skin:  Surgical incisions adbomen, Weeping arms, generally ecchymotic/pale  Last BM:  1/26-incontinent  Height:  Ht Readings from Last 1 Encounters:  08/04/2015 '5\' 6"'  (1.676 m)   Weight:  Wt Readings from Last 1 Encounters:  08/27/15 156 lb 15.5 oz (71.2 kg)   Wt Readings from Last 10 Encounters:  08/27/15 156 lb 15.5 oz (71.2 kg)  06/18/14 150 lb 4.8 oz (68.176 kg)  09/01/12 145 lb (65.772 kg)   Lowest wt this admit-126 lbs  (57.27 kg)  Ideal Body Weight:  59.1 kg  BMI:  Body mass index using dosing weight is 20.4 kg/(m^2).  Estimated Nutritional Needs:  Kcal:  1700-1900 (30-33 kcal/kg bw) Protein:  86-97 g Pro (1.5-1.7 g/kg bw) Fluid:  PER MD  EDUCATION NEEDS:  No education needs identified at this time  Burtis Junes RD, LDN Nutrition Pager: [phone] 08/28/2015 1:46 PM

## 2015-08-28 NOTE — Progress Notes (Signed)
ANTIBIOTIC CONSULT NOTE - INITIAL  Pharmacy Consult for Vancomycin and Primaxin Indication: SEPSIS  Allergies  Allergen Reactions  . Penicillins Hives and Rash    Has patient had a PCN reaction causing immediate rash, facial/tongue/throat swelling, SOB or lightheadedness with hypotension: Yes Has patient had a PCN reaction causing severe rash involving mucus membranes or skin necrosis: No Has patient had a PCN reaction that required hospitalization No Has patient had a PCN reaction occurring within the last 10 years: No If all of the above answers are "NO", then may proceed with Cephalosporin use.   Pt low risk for rxn to Primaxin.  Will ask RN to watch closely.  Patient Measurements: Height:  (167.6 cm) Weight: 156 lb 15.5 oz (71.2 kg) IBW/kg (Calculated) : 59.3  Vital Signs: Temp: 98.2 F (36.8 C) (01/27 0400) Temp Source: Axillary (01/27 0400) BP: 81/52 mmHg (01/27 0800) Pulse Rate: 115 (01/27 0800) Intake/Output from previous day: 01/26 0701 - 01/27 0700 In: 2000.9 [P.O.:720; I.V.:1280.9] Out: 2650 [Urine:2650] Intake/Output from this shift:    Labs:  Recent Labs  08/26/15 0440 08/27/15 0430 08/27/15 0926 08/28/15 0440  WBC 21.7*  --  31.1* 34.2*  HGB 9.3*  --  8.2* 10.9*  PLT 165  --  134* 155  CREATININE 0.82 0.97  --  0.77   Estimated Creatinine Clearance: 50.1 mL/min (by C-G formula based on Cr of 0.77). No results for input(s): VANCOTROUGH, VANCOPEAK, VANCORANDOM, GENTTROUGH, GENTPEAK, GENTRANDOM, TOBRATROUGH, TOBRAPEAK, TOBRARND, AMIKACINPEAK, AMIKACINTROU, AMIKACIN in the last 72 hours.   Microbiology: Recent Results (from the past 720 hour(s))  Urine culture     Status: None   Collection Time: 08/26/2015 11:28 PM  Result Value Ref Range Status   Specimen Description URINE, CATHETERIZED  Final   Special Requests NONE  Final   Culture   Final    NO GROWTH 1 DAY Performed at [facility]    Report Status 08/14/2015 FINAL  Final  MRSA  PCR Screening     Status: None   Collection Time: 08/13/15  3:31 AM  Result Value Ref Range Status   MRSA by PCR NEGATIVE NEGATIVE Final    Comment:        The GeneXpert MRSA Assay (FDA approved for NASAL specimens only), is one component of a comprehensive MRSA colonization surveillance program. It is not intended to diagnose MRSA infection nor to guide or monitor treatment for MRSA infections.   Culture, blood (x 2)     Status: None   Collection Time: 08/20/15  9:44 AM  Result Value Ref Range Status   Specimen Description BLOOD  Final   Special Requests NONE  Final   Culture NO GROWTH 5 DAYS  Final   Report Status 08/25/2015 FINAL  Final  Culture, blood (x 2)     Status: None   Collection Time: 08/20/15  9:55 AM  Result Value Ref Range Status   Specimen Description BLOOD  Final   Special Requests NONE  Final   Culture NO GROWTH 5 DAYS  Final   Report Status 08/25/2015 FINAL  Final  Culture, blood (routine x 2)     Status: None (Preliminary result)   Collection Time: 08/27/15 10:40 AM  Result Value Ref Range Status   Specimen Description BLOOD PICC LINE DRAWN BY RN  Final   Special Requests   Final    BOTTLES DRAWN AEROBIC AND ANAEROBIC AEB=8CC ANA=5CC   Culture NO GROWTH < 24 HOURS  Final   Report Status  PENDING  Incomplete  Culture, blood (routine x 2)     Status: None (Preliminary result)   Collection Time: 08/27/15 10:47 AM  Result Value Ref Range Status   Specimen Description BLOOD RIGHT ANTECUBITAL  Final   Special Requests BOTTLES DRAWN AEROBIC ONLY 6CC  Final   Culture NO GROWTH < 24 HOURS  Final   Report Status PENDING  Incomplete   Medical History: Past Medical History  Diagnosis Date  . Hypertension   . UTI (lower urinary tract infection)   . Neck pain   . Headache    Anti-infectives    Start     Dose/Rate Route Frequency Ordered Stop   08/28/15 1030  vancomycin (VANCOCIN) 1,500 mg in sodium chloride 0.9 % 500 mL IVPB     1,500 mg 250 mL/hr over  120 Minutes Intravenous  Once 08/28/15 0941     08/28/15 1000  imipenem-cilastatin (PRIMAXIN) 500 mg in sodium chloride 0.9 % 100 mL IVPB     500 mg 200 mL/hr over 30 Minutes Intravenous Every 8 hours 08/28/15 0944     08/22/15 1000  levofloxacin (LEVAQUIN) IVPB 500 mg  Status:  Discontinued     500 mg 100 mL/hr over 60 Minutes Intravenous Every 48 hours 08/20/15 1001 08/21/15 1250   08/22/15 1000  levofloxacin (LEVAQUIN) IVPB 750 mg  Status:  Discontinued     750 mg 100 mL/hr over 90 Minutes Intravenous Every 48 hours 08/21/15 1250 08/28/15 0927   08/20/15 1800  metroNIDAZOLE (FLAGYL) IVPB 500 mg  Status:  Discontinued     500 mg 100 mL/hr over 60 Minutes Intravenous Every 8 hours 08/20/15 0958 08/27/15 1008   08/20/15 1000  levofloxacin (LEVAQUIN) IVPB 750 mg     750 mg 100 mL/hr over 90 Minutes Intravenous  Once 08/20/15 0937 08/20/15 1140   08/20/15 1000  metroNIDAZOLE (FLAGYL) IVPB 500 mg     500 mg 100 mL/hr over 60 Minutes Intravenous  Once 08/20/15 0937 08/20/15 1347     Assessment: 82yo female.  Pt is s/p ex lap with partial bowel resection on 1/16.  Per MD, since her leukocytosis is not improving we will broaden her abx coverage to vancomycin and primaxin. Levaquin discontinued 1/27. She is not having any diarrhhea.  Pt has a noted PCN allergy but pt is low risk for rxn to Primaxin.  Rxn's to primaxin are infrequent.  Normalized clcr 45-50.  Goal of Therapy:  Vancomycin trough level 15-20 mcg/ml  Plan:  Vancomycin  IV now x 1 then Vancomycin  IV q24hrs Primaxin  IV q8h Vancomycin trough level at steady state Monitor labs, renal fxn, cultures, and progress  Margo Aye, Faten Frieson A 08/28/2015,9:45 AM

## 2015-08-29 ENCOUNTER — Inpatient Hospital Stay (HOSPITAL_COMMUNITY): Payer: Medicare Other

## 2015-08-29 DIAGNOSIS — R601 Generalized edema: Secondary | ICD-10-CM

## 2015-08-29 DIAGNOSIS — E43 Unspecified severe protein-calorie malnutrition: Secondary | ICD-10-CM

## 2015-08-29 LAB — CBC WITH DIFFERENTIAL/PLATELET
BASOS ABS: 0.2 10*3/uL — AB (ref 0.0–0.1)
BASOS PCT: 1 %
EOS ABS: 0 10*3/uL (ref 0.0–0.7)
EOS PCT: 0 %
HCT: 25.7 % — ABNORMAL LOW (ref 36.0–46.0)
HEMOGLOBIN: 8.6 g/dL — AB (ref 12.0–15.0)
Lymphocytes Relative: 3 %
Lymphs Abs: 0.9 10*3/uL (ref 0.7–4.0)
MCH: 27 pg (ref 26.0–34.0)
MCHC: 33.5 g/dL (ref 30.0–36.0)
MCV: 80.8 fL (ref 78.0–100.0)
Monocytes Absolute: 1.4 10*3/uL — ABNORMAL HIGH (ref 0.1–1.0)
Monocytes Relative: 5 %
NEUTROS PCT: 91 %
Neutro Abs: 23.9 10*3/uL — ABNORMAL HIGH (ref 1.7–7.7)
PLATELETS: 147 10*3/uL — AB (ref 150–400)
RBC: 3.18 MIL/uL — AB (ref 3.87–5.11)
RDW: 15.1 % (ref 11.5–15.5)
WBC: 26.3 10*3/uL — AB (ref 4.0–10.5)

## 2015-08-29 LAB — HEPARIN LEVEL (UNFRACTIONATED)
HEPARIN UNFRACTIONATED: 0.27 [IU]/mL — AB (ref 0.30–0.70)
Heparin Unfractionated: 0.22 IU/mL — ABNORMAL LOW (ref 0.30–0.70)

## 2015-08-29 LAB — CBC
HCT: 36.5 % (ref 36.0–46.0)
HEMOGLOBIN: 11.9 g/dL — AB (ref 12.0–15.0)
MCH: 26.3 pg (ref 26.0–34.0)
MCHC: 32.6 g/dL (ref 30.0–36.0)
MCV: 80.8 fL (ref 78.0–100.0)
Platelets: 177 10*3/uL (ref 150–400)
RBC: 4.52 MIL/uL (ref 3.87–5.11)
RDW: 15.4 % (ref 11.5–15.5)
WBC: 38.1 10*3/uL — AB (ref 4.0–10.5)

## 2015-08-29 LAB — COMPREHENSIVE METABOLIC PANEL
ALK PHOS: 54 U/L (ref 38–126)
ALT: 8 U/L — AB (ref 14–54)
AST: 14 U/L — ABNORMAL LOW (ref 15–41)
Albumin: 2.6 g/dL — ABNORMAL LOW (ref 3.5–5.0)
Anion gap: 8 (ref 5–15)
BILIRUBIN TOTAL: 0.8 mg/dL (ref 0.3–1.2)
BUN: 24 mg/dL — AB (ref 6–20)
CO2: 29 mmol/L (ref 22–32)
CREATININE: 0.68 mg/dL (ref 0.44–1.00)
Calcium: 7.3 mg/dL — ABNORMAL LOW (ref 8.9–10.3)
Chloride: 98 mmol/L — ABNORMAL LOW (ref 101–111)
GFR calc non Af Amer: >60 mL/min (ref >60)
Glucose, Bld: 143 mg/dL — ABNORMAL HIGH (ref 65–99)
Potassium: 3.8 mmol/L (ref 3.5–5.1)
Sodium: 135 mmol/L (ref 135–145)
TOTAL PROTEIN: 4.3 g/dL — AB (ref 6.5–8.1)

## 2015-08-29 MED ORDER — PHENYLEPHRINE HCL 10 MG/ML IJ SOLN
INTRAMUSCULAR | Status: AC
  Filled 2015-08-29: qty 4

## 2015-09-01 DIAGNOSIS — J9601 Acute respiratory failure with hypoxia: Secondary | ICD-10-CM | POA: Diagnosis present

## 2015-09-01 DIAGNOSIS — D62 Acute posthemorrhagic anemia: Secondary | ICD-10-CM | POA: Diagnosis not present

## 2015-09-01 DIAGNOSIS — I82622 Acute embolism and thrombosis of deep veins of left upper extremity: Secondary | ICD-10-CM | POA: Diagnosis not present

## 2015-09-01 DIAGNOSIS — R601 Generalized edema: Secondary | ICD-10-CM | POA: Diagnosis not present

## 2015-09-01 LAB — CULTURE, BLOOD (ROUTINE X 2)
Culture: NO GROWTH
Culture: NO GROWTH

## 2015-09-02 NOTE — Progress Notes (Addendum)
[facility] PROGRESS NOTE  Priscilla Wu BHA:[id number] DOB: 1925/08/13 DOA: 08/18/2015 PCP: Alice Reichert, MD  Assessment/Plan: 1. SBO, s/p ex lap with partial bowel resection on 1/16. No evidence of post op complications by CT 1/20. Tolerating solids. 2. Anasarca. Secondary to aggressive volume resuscitation, low albumin. Given low CVP and hypotension, she is likely becoming intravascularly volume depleted. Lasix is on hold. Follow urine output. 3. Bilateral pleural effusions, revealed on CT Chest / AP.  Attempted thoracentesis however IR stated there was not enough fluid. Will continue to monitor.  4. Hypotension. Patient continues to be hypotensive requiring phenylephrine. CVP is low. She may be intravascularly depleted from aggressive diuresis. Patient is on gentle hydration, albumin infusions. Continue to wean pressors as tolerated.  5. LUE thrombus associated with indwelling PICC line. LUE doppler positive for left upper extremity deep venous thrombosis surrounding the left upper extremity PICC in the brachial, axillary and subclavian veins.  Patient has been started on anticoagulants. Discussed with Dr. Arbie Cookey, vascular surgery who recommended removing PICC line now and 3-6 months of anticoagulation. We discussed if patient needed to be on anticoagulation for a certain amount of time before line was removed, and he recommended removing the line as soon as possible. 6. Hypokalemia, likely related to Lasix. Replaced. 7. Hypomagnesemia, replaced.  8. ABLA, secondary to trivial UGIB, anemia of critical illness. No evidence of bleeding. Appreciate GI input. No further evaluation planned.  9. Low-normal systolic function with wall motion abnormalities.   Per cardiology continue digoxin. She did not tolerate beta blockers due to hypotension.  10. Acute hypoxic respiratory failure. Improving. Secondary to bilateral compressive atelectasis, hiatal hernia, pleural effusions. Continue to wean  O2 as tolerated.  11. Large hiatal hernia, continue PPI.  12. Atrial fibrillation with RVR, remains in sinus rhythm/ST. Poor candidate for anticoagulation. Will monitor HR.   13. AKI, likely ATN secondary to hypotension. Resolved.  14. Leukocytosis. Unclear if this is related to worsening infection. Patient has been afebrile. Is receiving IV steroids and also may have an element of hemoconcentration from Lasix.  LFTs wnl. CT Chest/AP reveals bilateral pleural effusions compressive atelectasis. No clinical evidence of pancreatitis, and lipase WNL. She does have an underlying DVT which could be contributing to leukocytosis. Repeat blood cultures have not shown any growth to date.   She is not having any diarrhhea. Leukocytosis is improving, will continue broadened abx.  15. Hypoalbuminemia. Improved  Code Status: DNR confirmed with POA at bedside  DVT prophylaxis: SCDs Family Communication: Daughter at bedside Disposition Plan: Continue to monitor in ICU. Consider possible transfer to LTAC.   Consultants:  General Surgery   GI  Procedures:  Ex Lap and partial small bowel resection 1/16  PICC line placed  ECHO Study Conclusions  - Procedure narrative: Transthoracic echocardiography. Image quality was suboptimal. The study was technically difficult, as a result of poor acoustic windows and restricted patient mobility. - Left ventricle: The cavity size was normal. Wall thickness was increased in a pattern of mild LVH. There is inferoseptal hypokinesis to akinesis, consistent with prior infarct. Systolic function was mildly reduced. The estimated ejection fraction was in the range of 45% to 50%. The study is not technically sufficient to allow evaluation of LV diastolic function. - Aortic valve: Structurally normal valve. Trileaflet. Calcified annulus. Transvalvular velocity was within the normal range. There was no stenosis. There was trivial regurgitation. -  Aortic root: The aortic root was top normal in size at 3.8 cm. - Left atrium: The  atrium was mildly dilated. - Tricuspid valve: There was mild regurgitation. - Pulmonary arteries: PA peak pressure: 31 mm Hg (S) + RAP. - Systemic veins: Not visualized.  Antibiotics:  Levaquin 1/21>> 1/27  Flagyl 1/19>>1/26  Vancomycin 1/27 >>  Primaxin 1/27 >>  HPI/Subjective: Breathing is okay, has a mild cough. Per daughter, she ate part of a biscuit and drank coffee this morning without any difficulties. Daughter reports she does not typically wear O2 at home.    Objective: Filed Vitals:   08/04/2015 0645 08/02/2015 0700  BP:  106/56  Pulse: 97 80  Temp:    Resp: 28 24    Intake/Output Summary (Last 24 hours) at 08/13/2015 0739 Last data filed at 08/07/2015 0600  Gross per 24 hour  Intake 4299.33 ml  Output   1550 ml  Net 2749.33 ml   Filed Weights   08/26/15 0500 08/27/15 0500 08/13/2015 0400  Weight: 69.3 kg (152 lb 12.5 oz) 71.2 kg (156 lb 15.5 oz) 72.2 kg (159 lb 2.8 oz)    Exam:  General: NAD, looks comfortable Cardiovascular:  S1, S2 , RRR Respiratory: Diminished breath sounds bilaterally.   Crackles at right base.  Abdomen: soft, non-tender, no distention , bowel sounds normal Musculoskeletal: 2+ edema in RUE, 1+ edema in BLE.   Data Reviewed: Basic Metabolic Panel:  Recent Labs Lab 08/22/15 0740  08/24/15 0430 08/25/15 0400 08/26/15 0440 08/26/15 2008 08/27/15 0430 08/28/15 0440 08/25/2015 0500  NA 135  < > 139 139 141  --  137 134* 135  K 3.1*  < > 2.6* 3.7 2.4*  --  3.3* 2.9* 3.8  CL 103  < > 100* 100* 98*  --  98* 95* 98*  CO2 26  < > 29 31 35*  --  GLUCOSE 140*  < > 129* 131* 132*  --  140* 133* 143*  BUN 30*  < > 35* 35* 34*  --  39* 28* 24*  CREATININE 1.18*  < > 1.07* 0.95 0.82  --  0.97 0.77 0.68  CALCIUM 7.5*  < > 7.7* 7.5* 7.2*  --  7.5* 7.3* 7.3*  MG 1.3*  --  1.4*  --  1.3* 2.0  --   --   --   PHOS 2.3*  --  2.6  --   --  1.8*  --   --   --    < > = values in this interval not displayed. Liver Function Tests:  Recent Labs Lab 08/22/15 0740 08/24/15 0430 08/27/15 0430 08/28/15 0440 08/11/2015 0500  AST 14*  ALT 13* 11* 12* 12* 8*  ALKPHOS 94 77 70 77 54  BILITOT 0.5 0.5 0.7 0.6 0.8  PROT 3.8* 4.3* 4.4* 4.4* 4.3*  ALBUMIN 1.6* 2.5* 3.1* 2.4* 2.6*    CBC:  Recent Labs Lab 08/24/15 0430 08/26/15 0440 08/27/15 0926 08/28/15 0440 08/03/2015 0500  WBC 15.4* 21.7* 31.1* 34.2* 26.3*  NEUTROABS 13.7*  --   --   --  23.9*  HGB 8.7* 9.3* 8.2* 10.9* 8.6*  HCT 26.3* 28.1* 24.8* 33.2* 25.7*  MCV 82.4 82.2 81.0 80.8 80.8  PLT 147* 165 134* 155 147*     CBG:  Recent Labs Lab 08/22/15 2314 08/23/15 0356 08/23/15 0757 08/23/15 1144 08/23/15 1613  GLUCAP 129* 115* 141* 188* 164*    Recent Results (from the past 240 hour(s))  Culture, blood (x 2)     Status: None   Collection Time:  08/20/15  9:44 AM  Result Value Ref Range Status   Specimen Description BLOOD  Final   Special Requests NONE  Final   Culture NO GROWTH 5 DAYS  Final   Report Status 08/25/2015 FINAL  Final  Culture, blood (x 2)     Status: None   Collection Time: 08/20/15  9:55 AM  Result Value Ref Range Status   Specimen Description BLOOD  Final   Special Requests NONE  Final   Culture NO GROWTH 5 DAYS  Final   Report Status 08/25/2015 FINAL  Final  Culture, blood (routine x 2)     Status: None (Preliminary result)   Collection Time: 08/27/15 10:40 AM  Result Value Ref Range Status   Specimen Description BLOOD PICC LINE DRAWN BY RN  Final   Special Requests   Final    BOTTLES DRAWN AEROBIC AND ANAEROBIC AEB=8CC ANA=5CC   Culture NO GROWTH < 24 HOURS  Final   Report Status PENDING  Incomplete  Culture, blood (routine x 2)     Status: None (Preliminary result)   Collection Time: 08/27/15 10:47 AM  Result Value Ref Range Status   Specimen Description BLOOD RIGHT ANTECUBITAL  Final   Special Requests BOTTLES DRAWN AEROBIC ONLY 6CC   Final   Culture NO GROWTH < 24 HOURS  Final   Report Status PENDING  Incomplete     Studies: US Abdomen Limited  08/28/2015  CLINICAL DATA:  BILATERAL pleural effusions, for thoracentesis EXAM: CHEST ULTRASOUND COMPARISON:  None ; correlation CT chest abdomen and pelvis 08/21/2015 TECHNIQUE: Sonography of the posterior hemi thoraces was performed bilaterally with patient sitting upright. FINDINGS: Small BILATERAL pleural effusions are identified. Large hiatal hernia. Volumes of pleural fluid bilaterally appear insufficient for expected therapeutic benefit from thoracentesis. IMPRESSION: Small BILATERAL pleural effusions. Discussed with Dr. Kerry Hough. Thoracentesis was not performed at this time. Electronically Signed   By: Ulyses Southward M.D.   On: 08/28/2015 14:53   US Venous Img Upper Uni Left  08/28/2015  CLINICAL DATA:  82 year old female with left upper extremity DVT with PICC line in place EXAM: LEFT UPPER EXTREMITY VENOUS DOPPLER ULTRASOUND TECHNIQUE: Gray-scale sonography with graded compression, as well as color Doppler and duplex ultrasound were performed to evaluate the upper extremity deep venous system from the level of the subclavian vein and including the jugular, axillary, basilic, radial, ulnar and upper cephalic vein. Spectral Doppler was utilized to evaluate flow at rest and with distal augmentation maneuvers. COMPARISON:  Recent prior left upper extremity DVT ultrasound 08/26/2015 FINDINGS: Contralateral Subclavian Vein: Respiratory phasicity is normal and symmetric with the symptomatic side. No evidence of thrombus. Normal compressibility. Internal Jugular Vein: No evidence of thrombus. Normal compressibility, respiratory phasicity and response to augmentation. Subclavian Vein: Persistent thrombus surrounding the PICC line. The vessel is not compressible. Thrombus is subocclusive. There is slight interval flow. Axillary Vein: Thrombus extends through the axillary vein. Cephalic Vein: No  evidence of thrombus. Normal compressibility, respiratory phasicity and response to augmentation. Basilic Vein: No evidence of thrombus. Normal compressibility, respiratory phasicity and response to augmentation. Brachial Veins: Thrombus extends into the brachial vein. Radial Veins: No evidence of thrombus. Normal compressibility, respiratory phasicity and response to augmentation. Ulnar Veins: No evidence of thrombus. Normal compressibility, respiratory phasicity and response to augmentation. Venous Reflux:  None visualized. Other Findings:  None visualized. IMPRESSION: 1. Positive for left upper extremity deep venous thrombosis surrounding the left upper extremity PICC in the brachial, axillary and subclavian veins. Signed, Kandis Cocking.  Archer Asa, MD [facility] Electronically Signed   By: Malachy Moan M.D.   On: 08/28/2015 15:07   Dg Chest Port 1 View  08/28/2015  CLINICAL DATA:  Cough. EXAM: PORTABLE CHEST 1 VIEW COMPARISON:  08/25/2015. FINDINGS: Left PICC line stable position . Mediastinum is unremarkable . Heart size stable Persistent basilar atelectasis and/or infiltrates. Persistent small pleural effusions. No pneumothorax. Prominent hiatal hernia. IMPRESSION: 1.  Left PICC line in stable position. 2.Low lung volumes with persistent bibasilar atelectasis and/or infiltrates and bilateral pleural effusions. A component congestive heart failure cannot be excluded. 3.  Prominent sliding hiatal hernia. Electronically Signed   By: Maisie Fus  Register   On: 08/28/2015 07:51    Scheduled Meds: . digoxin  0.125 mg Oral Daily  . feeding supplement  1 Container Oral TID BM  . hydrocortisone sod succinate (SOLU-CORTEF) inj  50 mg Intravenous Q12H  . imipenem-cilastatin  500 mg Intravenous Q8H  . pantoprazole  40 mg Oral BID AC  . vancomycin  1,250 mg Intravenous Q24H   Continuous Infusions: . 0.9 % NaCl with KCl 40 mEq / L 75 mL/hr (08/28/15 1045)   . heparin 1,050 Units/hr (08/28/15 1519)  . phenylephrine (NEO-SYNEPHRINE) Adult infusion 110 mcg/min (08/07/2015 0328)    Principal Problem:   SBO (small bowel obstruction) (HCC) Active Problems:   Hypertension   Nausea and vomiting in adult patient   AKI (acute kidney injury) (HCC)   Atrial fibrillation with RVR (HCC)   Hypotension   Acute renal failure (HCC)   Arterial hypotension   Palliative care encounter   DNR (do not resuscitate) discussion   Protein-calorie malnutrition, severe    Time spent: 35 minutes   Kylin Dubs. MD [facility] Pager [phone]. If 7PM-7AM, please contact night-coverage at [email or web address], password [facility] 08/30/2015, 7:39 AM  LOS: 16 days     By signing my name below, I, Burnett Harry, attest that this documentation has been prepared under the direction and in the presence of [employer]. MD Electronically Signed: Burnett Harry, Scribe. 09/01/2015 8:54am   I, Dr. Erick Blinks, personally performed the services described in this documentaiton. All medical record entries made by the scribe were at my direction and in my presence. I have reviewed the chart and agree that the record reflects my personal performance and is accurate and complete  Erick Blinks, MD, 08/30/2015 9:32 AM   Addendum: 18:45  Called by staff for patient heart rate in the 130's, sinus tachycardia and hypotension with a blood pressure in the 70's. Patient is on phenylephrine. She is less responsive at this time. CVP is 6. She is receiving IV hydration and urine output has been poor. She is third spacing fluids, developing pulmonary edema and pleural effusions. Her respiratory effort is increasing. Mental status is declining. After a long conversation with her son [place], [place], the decision was made to change direction of care towards comfort care. We will discontinue abx and pressors and focus on symptom management. Prognosis is poor.  Verley Pariseau

## 2015-09-02 NOTE — Progress Notes (Signed)
ANTICOAGULATION CONSULT NOTE   Pharmacy Consult for heparin Indication: DVT  Allergies  Allergen Reactions  . Penicillins Hives and Rash    Has patient had a PCN reaction causing immediate rash, facial/tongue/throat swelling, SOB or lightheadedness with hypotension: Yes Has patient had a PCN reaction causing severe rash involving mucus membranes or skin necrosis: No Has patient had a PCN reaction that required hospitalization No Has patient had a PCN reaction occurring within the last 10 years: No If all of the above answers are "NO", then may proceed with Cephalosporin use.    Patient Measurements: Height:  (167.6 cm) Weight: 159 lb 2.8 oz (72.2 kg) IBW/kg (Calculated) : 59.3 Heparin Dosing Weight: 59.4  Vital Signs: Temp: 98 F (36.7 C) (01/28 0400) Temp Source: Axillary (01/28 0400) BP: 105/46 mmHg (01/28 0800) Pulse Rate: 84 (01/28 0800)  Labs:  Recent Labs  08/27/15 0430  08/27/15 0926 08/27/15 1955 08/28/15 0440 2015/08/30 0500  HGB  --   < > 8.2*  --  10.9* 8.6*  HCT  --   --  24.8*  --  33.2* 25.7*  PLT  --   --  134*  --  155 147*  HEPARINUNFRC  --   --   --  <0.10* 0.31 0.22*  CREATININE 0.97  --   --   --  0.77 0.68  < > = values in this interval not displayed. Estimated Creatinine Clearance: 50.4 mL/min (by C-G formula based on Cr of 0.68).  Medical History: Past Medical History  Diagnosis Date  . Hypertension   . UTI (lower urinary tract infection)   . Neck pain   . Headache    Medications:  Prescriptions prior to admission  Medication Sig Dispense Refill Last Dose  . lisinopril (PRINIVIL,ZESTRIL) 20 MG tablet Take 20 mg by mouth at bedtime.   08/11/2015  . Multiple Vitamin (MULTIVITAMIN) tablet Take 1 tablet by mouth at bedtime.    08/11/2015  . omeprazole (PRILOSEC OTC) 20 MG tablet Take 20 mg by mouth at bedtime.    08/11/2015   Assessment: 82 yo lady to start heparin for DVT in L subclavian vein.  She had partial bowel resection on 1/16  and has had trivial UGIB. Heparin level is below goal.  H/H trending down.     No bleeding reported.   Goal of Therapy:  Heparin level 0.3-0.7 units/ml Monitor platelets by anticoagulation protocol: Yes   Plan:  Increase Heparin to 1200 units/hr Recheck Heparin level and CBC in 8 hrs.   Check heparin level daily with CBC Monitor for bleeding complications  Thanks for allowing pharmacy to be a part of this patient's care.  Valrie Hart, PharmD Clinical Pharmacist 08/30/2015,8:17 AM

## 2015-09-02 NOTE — Discharge Summary (Addendum)
Death Summary  NIMCO BIVENS ZOX:[id number] DOB: 09/13/1925 DOA: Aug 23, 2015  PCP: Alice Reichert, MD   Admit date: 08/23/15 Date of Death: 09-Sep-2015  Final Diagnoses:  Principal Problem:   SBO (small bowel obstruction) (HCC) Active Problems:   Hypertension   Nausea and vomiting in adult patient   AKI (acute kidney injury) (HCC) with Acute Tubular Necrosis   Atrial fibrillation with RVR (HCC)   Hypotension   Acute renal failure (HCC)   Arterial hypotension   Palliative care encounter   DNR (do not resuscitate) discussion   Protein-calorie malnutrition, severe   Deep vein thrombosis (DVT) of left upper extremity (HCC)   Acute respiratory failure with hypoxia (HCC)   Acute blood loss anemia   Anasarca      History of present illness:  82 year old female who presented to the hospital with complaints of vomiting. She was evaluated in emergency room and found to have a small bowel obstruction. NG tube was placed for suction and she was admitted to the hospital for further treatments.  Hospital Course:  50 yof with a hx of HTN presented with nausea and vomiting. Abdominal xray and CT scan consistent with SBO and possibly acute pancreatitis. General surgery was consulted who performed an ex lap with partial bowel resection on 1/16. Postoperatively she developed profound hypotension refractory to volume resuscitation and necessitating vasopressor support for several days. Although cortisol was normal, after starting stress test steroids she was quickly weaned off vasopressors. From a surgical standpoint she had done well and repeat CT abdomen and pelvis did not show any acute findings related to surgery. Unfortunately with her hypoalbuminemia and aggressive IV hydration she developed pleural effusions and anasarca. She was started on intravenous diuresis to help with her pleural effusions and resultant acute respiratory failure. Unfortunately she became hypotensive again had to be placed  back on vasopressors. She was started back on gentle hydration since CVP was noted to be low at 6. Despite pressors and IV fluids, she became increasingly hypotensive and tachycardic. She was found to have a left upper extremity DVT associated with her PICC line. She was started on intravenous heparin and PICC line was removed. Despite aggressive treatments, she became increasingly hypotensive and tachycardic with a low CVP. When fluids were administered, she developed worsening pleural effusions and hypoxia. After several discussions with the family, and palliative care input, it was decided to transition the patient to comfort care. Her vasopressors were discontinued and within 15 minutes the patient passed away. Her daughter was at the bedside and was provided support.   Time:  Signed:  Jaylyne Breese  [facility] 09/01/2015, 7:31 PM

## 2015-09-02 DEATH — deceased

## 2016-08-06 IMAGING — CR DG CHEST 1V PORT
2 series · 2 of 2 positions shown · non-contrast
Comparison: Radiograph dated 08/20/2015

CLINICAL DATA: 89-year-old female with with a left-sided stent
placement.

EXAM:
PORTABLE CHEST 1 VIEW

[ap (1 of 2)]
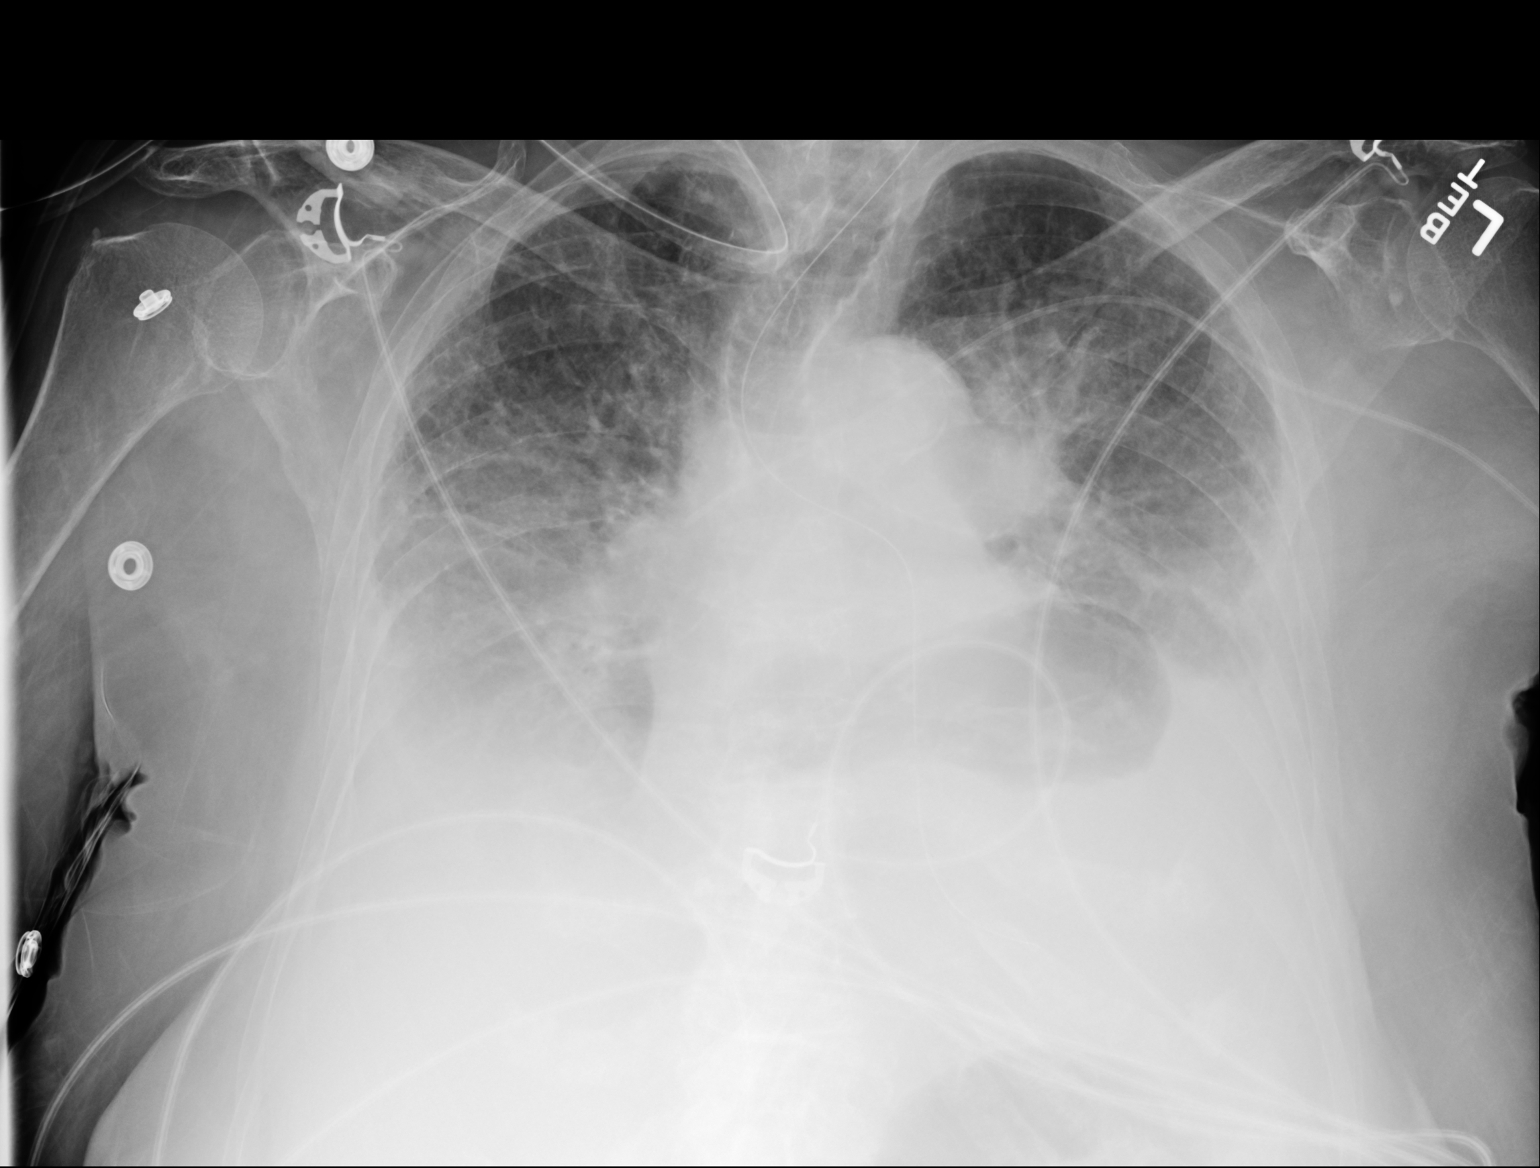

[ap (2 of 2)]
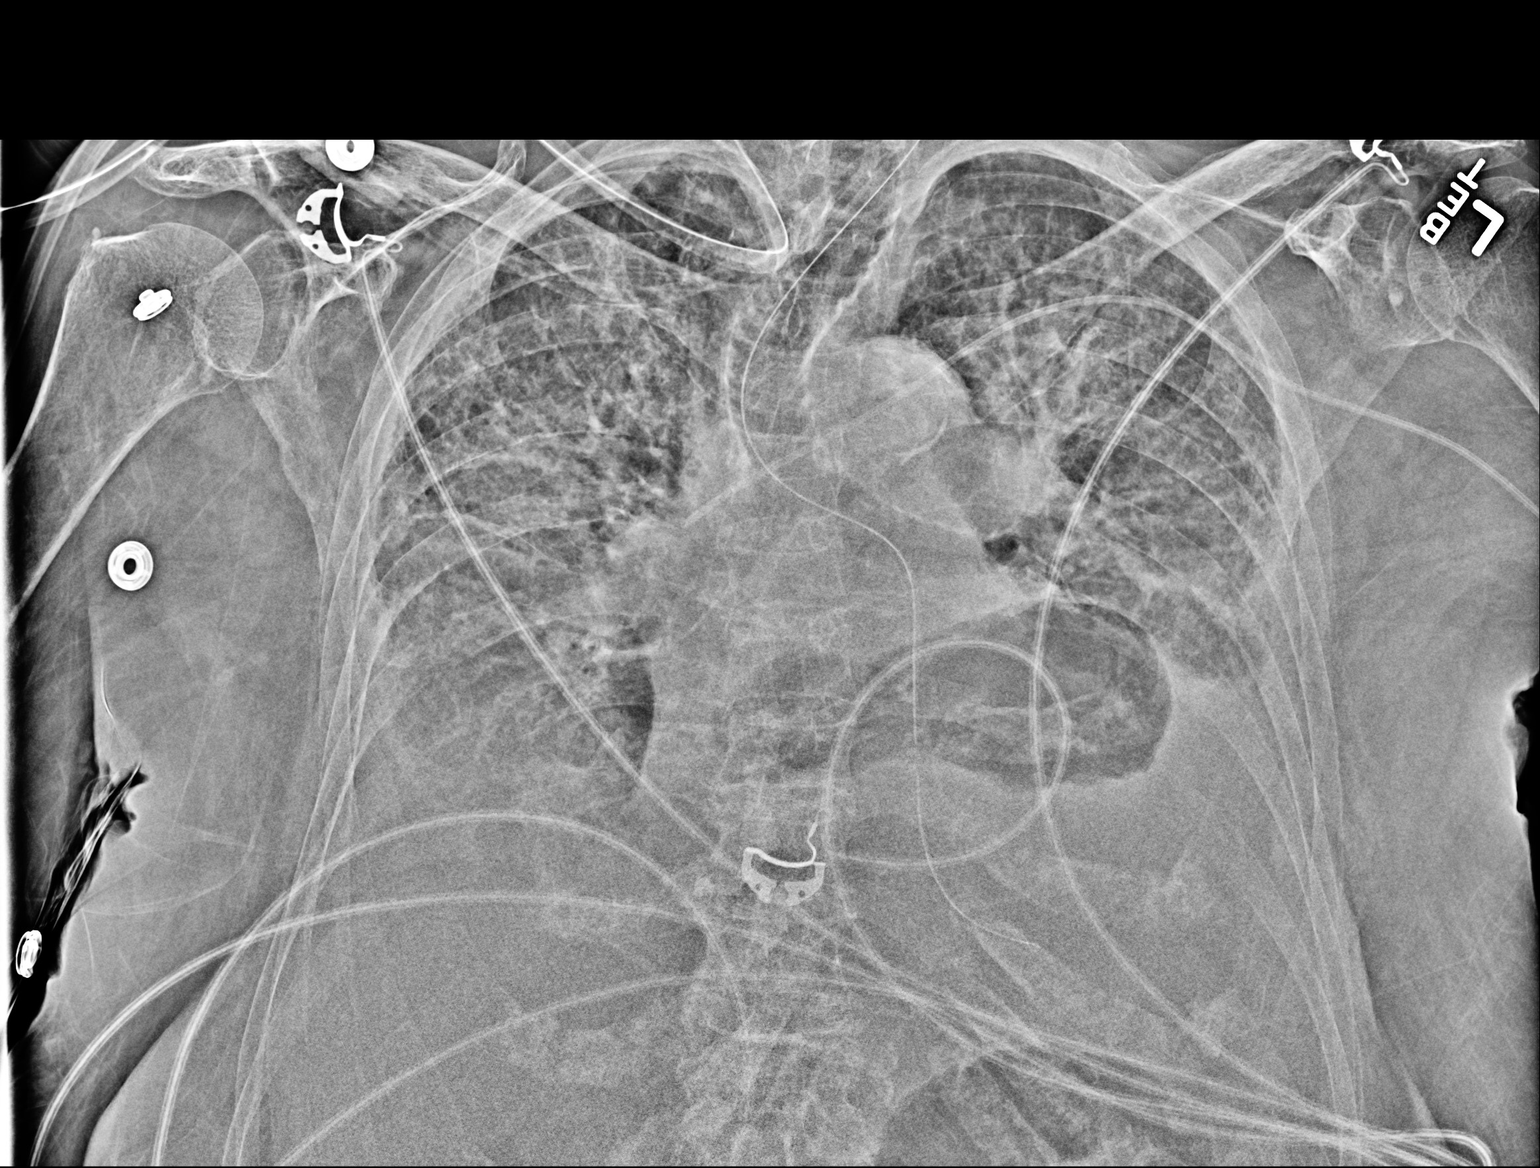

[2 of 2 positions shown; findings below may reference images not displayed]

FINDINGS: A left-sided PICC is noted with tip over central SVC in stable
positioning. Endotracheal tube the tip in the left hemiabdomen.
Bilateral pleural effusions and lower lobe airspace opacities again
noted. Stable cardiac silhouette. No pneumothorax. No acute osseous
pathology.
IMPRESSION: No interval change.

## 2016-08-14 IMAGING — CR DG CHEST 1V PORT
1 series · 1 of 1 positions shown · non-contrast
Comparison: 08/25/2015.

CLINICAL DATA: Cough.

EXAM:
PORTABLE CHEST 1 VIEW

[ap portable]
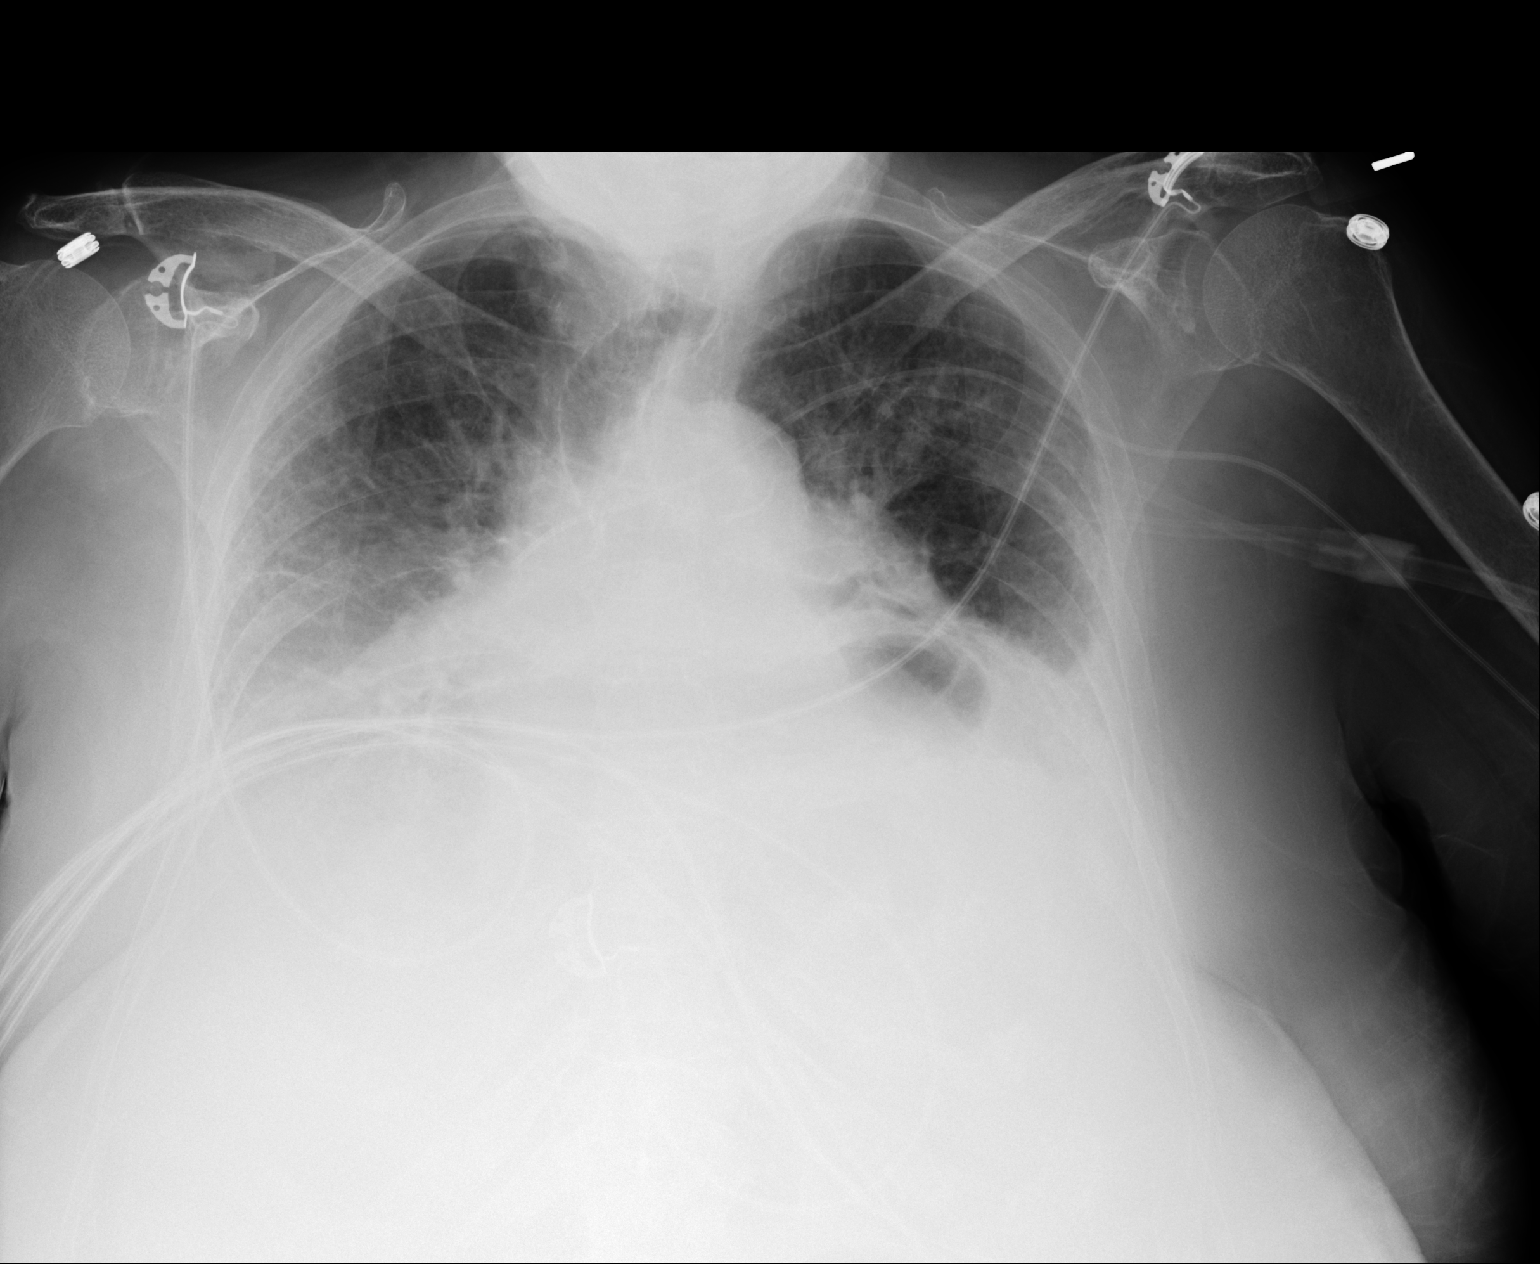

[1 of 1 positions shown; findings below may reference images not displayed]

FINDINGS: Left PICC line stable position . Mediastinum is unremarkable . Heart
size stable Persistent basilar atelectasis and/or infiltrates.
Persistent small pleural effusions. No pneumothorax. Prominent
hiatal hernia.
IMPRESSION: 1.  Left PICC line in stable position.

2.Low lung volumes with persistent bibasilar atelectasis and/or
infiltrates and bilateral pleural effusions. A component congestive
heart failure cannot be excluded.

3.  Prominent sliding hiatal hernia.

## 2016-08-15 IMAGING — CR DG CHEST 1V PORT
1 series · 1 of 1 positions shown · non-contrast
Comparison: 08/28/2015.

CLINICAL DATA: Central line placement.

EXAM:
PORTABLE CHEST 1 VIEW

[ap portable]
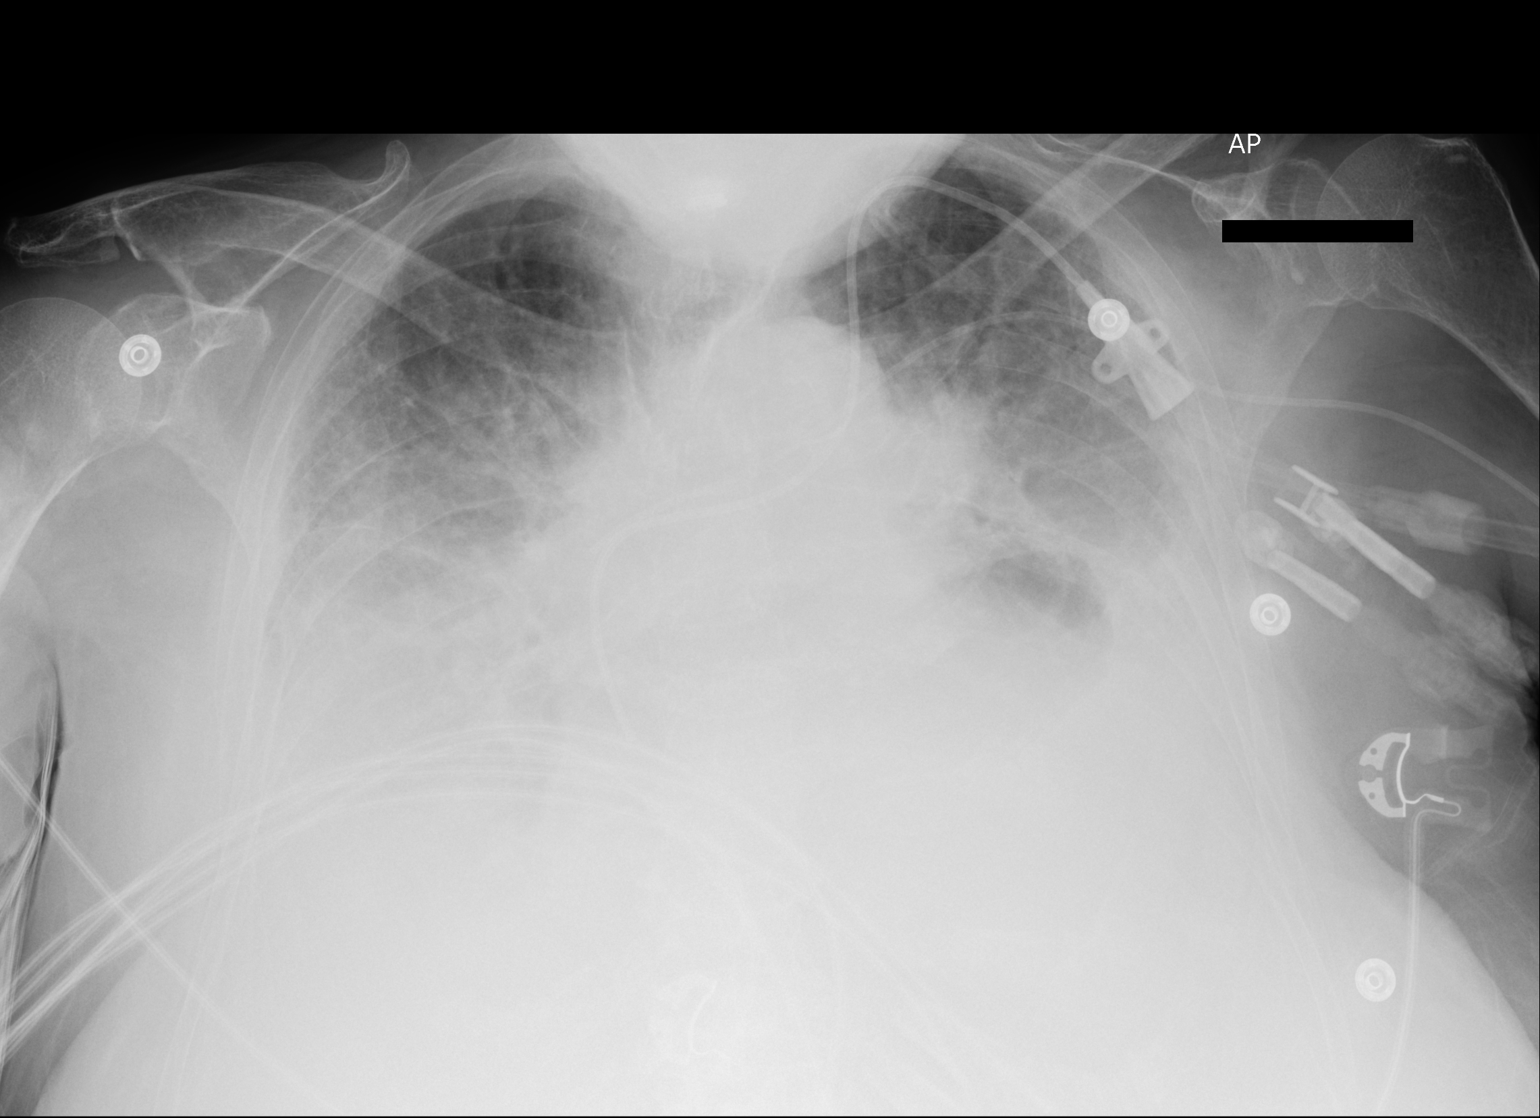

[1 of 1 positions shown; findings below may reference images not displayed]

FINDINGS: 5318 hours. Low lung volumes with diffuse interstitial and airspace
disease and bilateral pleural effusions. The cardio pericardial
silhouette is enlarged. Left PICC line is again noted with tip
position overlying the innominate vein confluence. There is a new
left IJ central venous catheter with the tip overlying the distal
SVC region. No left-sided pneumothorax is evident. Bones are
diffusely demineralized. Telemetry leads overlie the chest.
IMPRESSION: New left IJ central line with tip overlying the distal SVC. No
evidence for left-sided pneumothorax.
